# Patient Record
Sex: Female | Born: 1940 | Race: White | Hispanic: No
Health system: Southern US, Community
[De-identification: ages and names within clinical notes are randomized; demographics above are authoritative.]

---

## 2001-11-29 ENCOUNTER — Other Ambulatory Visit: Admission: RE | Admit: 2001-11-29 | Discharge: 2001-11-29 | Payer: Self-pay | Admitting: Orthopaedic Surgery

## 2002-07-17 ENCOUNTER — Other Ambulatory Visit: Admission: RE | Admit: 2002-07-17 | Discharge: 2002-07-17 | Payer: Self-pay | Admitting: Orthopaedic Surgery

## 2003-11-30 ENCOUNTER — Ambulatory Visit (HOSPITAL_BASED_OUTPATIENT_CLINIC_OR_DEPARTMENT_OTHER): Admission: RE | Admit: 2003-11-30 | Discharge: 2003-11-30 | Payer: Self-pay | Admitting: Specialist

## 2003-11-30 ENCOUNTER — Ambulatory Visit (HOSPITAL_COMMUNITY): Admission: RE | Admit: 2003-11-30 | Discharge: 2003-11-30 | Payer: Self-pay | Admitting: Specialist

## 2005-04-03 ENCOUNTER — Other Ambulatory Visit: Admission: RE | Admit: 2005-04-03 | Discharge: 2005-04-03 | Payer: Self-pay | Admitting: Orthopaedic Surgery

## 2005-11-07 ENCOUNTER — Inpatient Hospital Stay (HOSPITAL_COMMUNITY): Admission: RE | Admit: 2005-11-07 | Discharge: 2005-11-11 | Payer: Self-pay | Admitting: Orthopaedic Surgery

## 2005-12-12 ENCOUNTER — Inpatient Hospital Stay (HOSPITAL_COMMUNITY): Admission: RE | Admit: 2005-12-12 | Discharge: 2005-12-15 | Payer: Self-pay | Admitting: Orthopaedic Surgery

## 2007-01-28 IMAGING — CR DG KNEE 1-2V PORT*L*
2 series · 2 of 2 positions shown · non-contrast
Comparison: none

CLINICAL DATA: Severe degenerative joint disease, left knee. Total left knee arthroplasty.  
 LEFT KNEE PORTABLE - 2 VIEW:
 AP and lateral portable views of the left knee are made 11/07/05 at 4622 hours and show good position of all three components of the total knee prosthesis.  There is a large surgical drain associated with the superior aspect of the knee joint.

[view not recorded (1 of 2)]
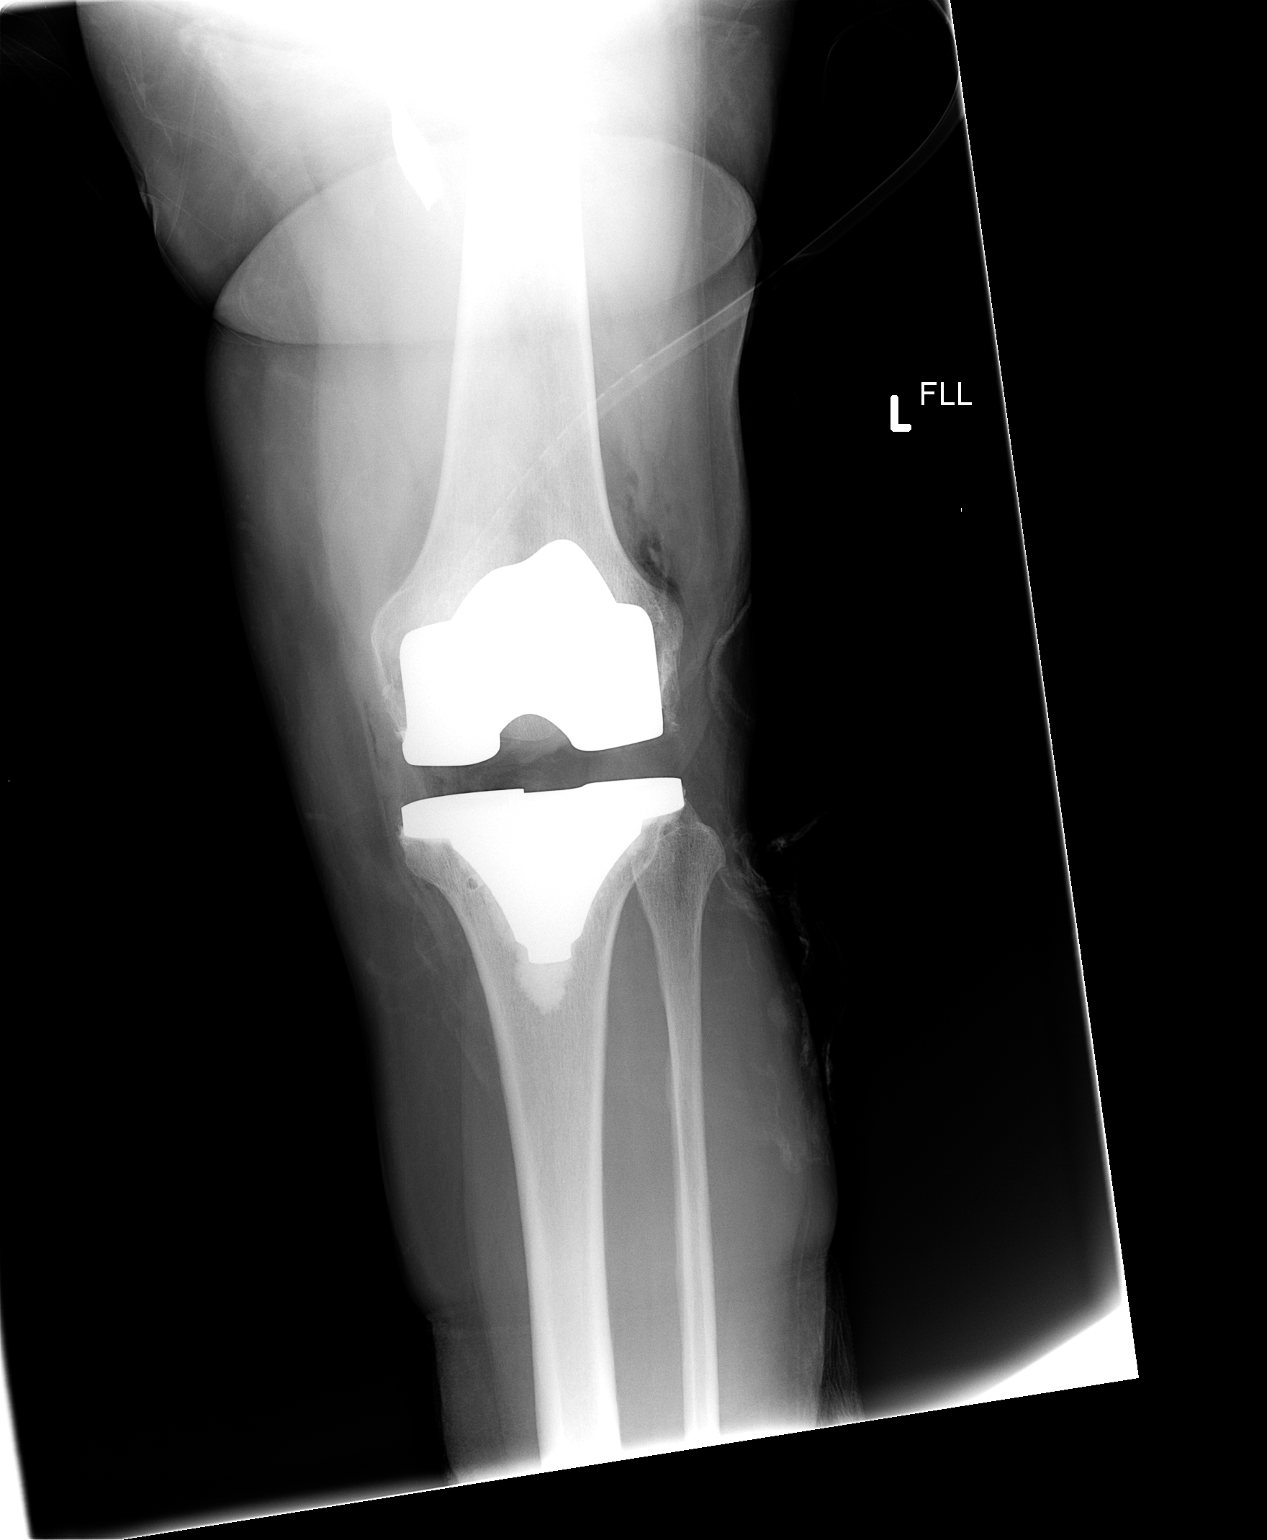

[view not recorded (2 of 2)]
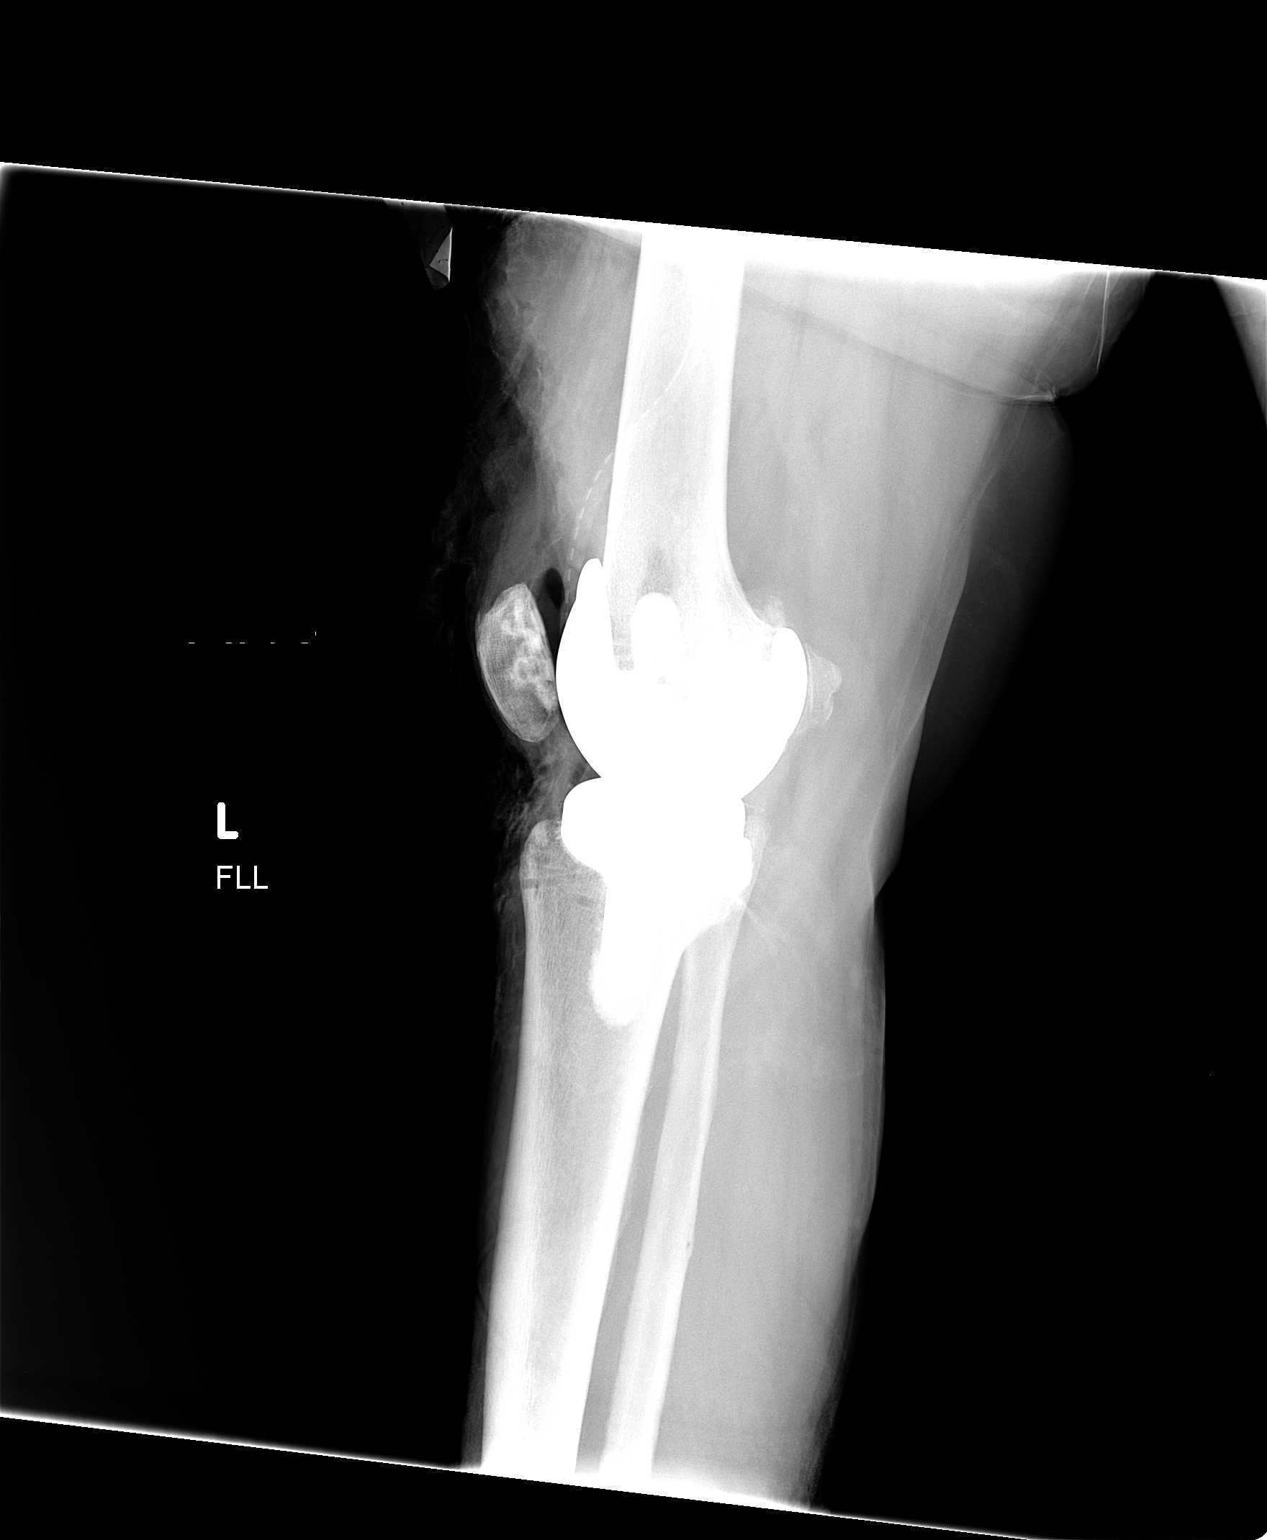

[2 of 2 positions shown; findings below may reference images not displayed]

IMPRESSION: Good position, all three components, left total knee prosthesis.

## 2015-11-04 DIAGNOSIS — I10 Essential (primary) hypertension: Secondary | ICD-10-CM | POA: Diagnosis not present

## 2015-11-04 DIAGNOSIS — Z789 Other specified health status: Secondary | ICD-10-CM | POA: Diagnosis not present

## 2015-11-04 DIAGNOSIS — Z6841 Body Mass Index (BMI) 40.0 and over, adult: Secondary | ICD-10-CM | POA: Diagnosis not present

## 2015-11-04 DIAGNOSIS — E7800 Pure hypercholesterolemia, unspecified: Secondary | ICD-10-CM | POA: Diagnosis not present

## 2015-11-04 DIAGNOSIS — M25551 Pain in right hip: Secondary | ICD-10-CM | POA: Diagnosis not present

## 2015-12-16 DIAGNOSIS — Z1389 Encounter for screening for other disorder: Secondary | ICD-10-CM | POA: Diagnosis not present

## 2015-12-16 DIAGNOSIS — Z0000 Encounter for general adult medical examination without abnormal findings: Secondary | ICD-10-CM | POA: Diagnosis not present

## 2015-12-16 DIAGNOSIS — Z418 Encounter for other procedures for purposes other than remedying health state: Secondary | ICD-10-CM | POA: Diagnosis not present

## 2015-12-16 DIAGNOSIS — E7800 Pure hypercholesterolemia, unspecified: Secondary | ICD-10-CM | POA: Diagnosis not present

## 2015-12-16 DIAGNOSIS — Z7189 Other specified counseling: Secondary | ICD-10-CM | POA: Diagnosis not present

## 2015-12-16 DIAGNOSIS — Z79899 Other long term (current) drug therapy: Secondary | ICD-10-CM | POA: Diagnosis not present

## 2015-12-16 DIAGNOSIS — E039 Hypothyroidism, unspecified: Secondary | ICD-10-CM | POA: Diagnosis not present

## 2015-12-16 DIAGNOSIS — L309 Dermatitis, unspecified: Secondary | ICD-10-CM | POA: Diagnosis not present

## 2015-12-16 DIAGNOSIS — Z1211 Encounter for screening for malignant neoplasm of colon: Secondary | ICD-10-CM | POA: Diagnosis not present

## 2015-12-23 DIAGNOSIS — M47816 Spondylosis without myelopathy or radiculopathy, lumbar region: Secondary | ICD-10-CM | POA: Diagnosis not present

## 2015-12-23 DIAGNOSIS — M4806 Spinal stenosis, lumbar region: Secondary | ICD-10-CM | POA: Diagnosis not present

## 2015-12-23 DIAGNOSIS — M4316 Spondylolisthesis, lumbar region: Secondary | ICD-10-CM | POA: Diagnosis not present

## 2015-12-23 DIAGNOSIS — M419 Scoliosis, unspecified: Secondary | ICD-10-CM | POA: Diagnosis not present

## 2016-01-25 DIAGNOSIS — E7800 Pure hypercholesterolemia, unspecified: Secondary | ICD-10-CM | POA: Diagnosis not present

## 2016-01-25 DIAGNOSIS — I10 Essential (primary) hypertension: Secondary | ICD-10-CM | POA: Diagnosis not present

## 2016-02-14 DIAGNOSIS — I10 Essential (primary) hypertension: Secondary | ICD-10-CM | POA: Diagnosis not present

## 2016-02-14 DIAGNOSIS — E7800 Pure hypercholesterolemia, unspecified: Secondary | ICD-10-CM | POA: Diagnosis not present

## 2016-03-02 DIAGNOSIS — E2839 Other primary ovarian failure: Secondary | ICD-10-CM | POA: Diagnosis not present

## 2016-03-03 DIAGNOSIS — E7800 Pure hypercholesterolemia, unspecified: Secondary | ICD-10-CM | POA: Diagnosis not present

## 2016-03-03 DIAGNOSIS — I10 Essential (primary) hypertension: Secondary | ICD-10-CM | POA: Diagnosis not present

## 2016-03-23 DIAGNOSIS — Z1231 Encounter for screening mammogram for malignant neoplasm of breast: Secondary | ICD-10-CM | POA: Diagnosis not present

## 2016-03-23 DIAGNOSIS — H2511 Age-related nuclear cataract, right eye: Secondary | ICD-10-CM | POA: Diagnosis not present

## 2016-03-23 DIAGNOSIS — H538 Other visual disturbances: Secondary | ICD-10-CM | POA: Diagnosis not present

## 2016-04-03 DIAGNOSIS — Z981 Arthrodesis status: Secondary | ICD-10-CM | POA: Diagnosis not present

## 2016-04-03 DIAGNOSIS — M47816 Spondylosis without myelopathy or radiculopathy, lumbar region: Secondary | ICD-10-CM | POA: Diagnosis not present

## 2016-04-03 DIAGNOSIS — M4326 Fusion of spine, lumbar region: Secondary | ICD-10-CM | POA: Diagnosis not present

## 2016-04-05 DIAGNOSIS — M47816 Spondylosis without myelopathy or radiculopathy, lumbar region: Secondary | ICD-10-CM | POA: Diagnosis not present

## 2016-04-10 DIAGNOSIS — H2511 Age-related nuclear cataract, right eye: Secondary | ICD-10-CM | POA: Diagnosis not present

## 2016-04-10 DIAGNOSIS — Z6841 Body Mass Index (BMI) 40.0 and over, adult: Secondary | ICD-10-CM | POA: Diagnosis not present

## 2016-04-10 DIAGNOSIS — E039 Hypothyroidism, unspecified: Secondary | ICD-10-CM | POA: Diagnosis not present

## 2016-04-10 DIAGNOSIS — I10 Essential (primary) hypertension: Secondary | ICD-10-CM | POA: Diagnosis not present

## 2016-04-10 DIAGNOSIS — M1990 Unspecified osteoarthritis, unspecified site: Secondary | ICD-10-CM | POA: Diagnosis not present

## 2016-04-10 DIAGNOSIS — H5200 Hypermetropia, unspecified eye: Secondary | ICD-10-CM | POA: Diagnosis not present

## 2016-04-10 DIAGNOSIS — H269 Unspecified cataract: Secondary | ICD-10-CM | POA: Diagnosis not present

## 2016-04-10 DIAGNOSIS — H524 Presbyopia: Secondary | ICD-10-CM | POA: Diagnosis not present

## 2016-04-10 DIAGNOSIS — Z79899 Other long term (current) drug therapy: Secondary | ICD-10-CM | POA: Diagnosis not present

## 2016-04-10 DIAGNOSIS — E669 Obesity, unspecified: Secondary | ICD-10-CM | POA: Diagnosis not present

## 2016-04-10 DIAGNOSIS — K219 Gastro-esophageal reflux disease without esophagitis: Secondary | ICD-10-CM | POA: Diagnosis not present

## 2016-04-10 DIAGNOSIS — H538 Other visual disturbances: Secondary | ICD-10-CM | POA: Diagnosis not present

## 2016-04-10 DIAGNOSIS — H2513 Age-related nuclear cataract, bilateral: Secondary | ICD-10-CM | POA: Diagnosis not present

## 2016-04-10 DIAGNOSIS — H52209 Unspecified astigmatism, unspecified eye: Secondary | ICD-10-CM | POA: Diagnosis not present

## 2016-04-10 DIAGNOSIS — H04129 Dry eye syndrome of unspecified lacrimal gland: Secondary | ICD-10-CM | POA: Diagnosis not present

## 2016-04-17 DIAGNOSIS — I10 Essential (primary) hypertension: Secondary | ICD-10-CM | POA: Diagnosis not present

## 2016-04-17 DIAGNOSIS — E7800 Pure hypercholesterolemia, unspecified: Secondary | ICD-10-CM | POA: Diagnosis not present

## 2016-05-03 DIAGNOSIS — Z299 Encounter for prophylactic measures, unspecified: Secondary | ICD-10-CM | POA: Diagnosis not present

## 2016-05-03 DIAGNOSIS — G4700 Insomnia, unspecified: Secondary | ICD-10-CM | POA: Diagnosis not present

## 2016-05-03 DIAGNOSIS — M810 Age-related osteoporosis without current pathological fracture: Secondary | ICD-10-CM | POA: Diagnosis not present

## 2016-05-03 DIAGNOSIS — M545 Low back pain: Secondary | ICD-10-CM | POA: Diagnosis not present

## 2016-05-19 DIAGNOSIS — E7800 Pure hypercholesterolemia, unspecified: Secondary | ICD-10-CM | POA: Diagnosis not present

## 2016-05-19 DIAGNOSIS — I10 Essential (primary) hypertension: Secondary | ICD-10-CM | POA: Diagnosis not present

## 2016-05-25 DIAGNOSIS — H538 Other visual disturbances: Secondary | ICD-10-CM | POA: Diagnosis not present

## 2016-05-25 DIAGNOSIS — H2512 Age-related nuclear cataract, left eye: Secondary | ICD-10-CM | POA: Diagnosis not present

## 2016-06-12 DIAGNOSIS — Z79899 Other long term (current) drug therapy: Secondary | ICD-10-CM | POA: Diagnosis not present

## 2016-06-12 DIAGNOSIS — K219 Gastro-esophageal reflux disease without esophagitis: Secondary | ICD-10-CM | POA: Diagnosis not present

## 2016-06-12 DIAGNOSIS — M5430 Sciatica, unspecified side: Secondary | ICD-10-CM | POA: Diagnosis not present

## 2016-06-12 DIAGNOSIS — H04129 Dry eye syndrome of unspecified lacrimal gland: Secondary | ICD-10-CM | POA: Diagnosis not present

## 2016-06-12 DIAGNOSIS — H5200 Hypermetropia, unspecified eye: Secondary | ICD-10-CM | POA: Diagnosis not present

## 2016-06-12 DIAGNOSIS — M1990 Unspecified osteoarthritis, unspecified site: Secondary | ICD-10-CM | POA: Diagnosis not present

## 2016-06-12 DIAGNOSIS — I10 Essential (primary) hypertension: Secondary | ICD-10-CM | POA: Diagnosis not present

## 2016-06-12 DIAGNOSIS — H2512 Age-related nuclear cataract, left eye: Secondary | ICD-10-CM | POA: Diagnosis not present

## 2016-06-12 DIAGNOSIS — E669 Obesity, unspecified: Secondary | ICD-10-CM | POA: Diagnosis not present

## 2016-06-12 DIAGNOSIS — H538 Other visual disturbances: Secondary | ICD-10-CM | POA: Diagnosis not present

## 2016-06-12 DIAGNOSIS — Z961 Presence of intraocular lens: Secondary | ICD-10-CM | POA: Diagnosis not present

## 2016-06-12 DIAGNOSIS — Z6841 Body Mass Index (BMI) 40.0 and over, adult: Secondary | ICD-10-CM | POA: Diagnosis not present

## 2016-06-12 DIAGNOSIS — H269 Unspecified cataract: Secondary | ICD-10-CM | POA: Diagnosis not present

## 2016-06-12 DIAGNOSIS — E039 Hypothyroidism, unspecified: Secondary | ICD-10-CM | POA: Diagnosis not present

## 2016-06-12 DIAGNOSIS — H524 Presbyopia: Secondary | ICD-10-CM | POA: Diagnosis not present

## 2016-06-12 DIAGNOSIS — H52209 Unspecified astigmatism, unspecified eye: Secondary | ICD-10-CM | POA: Diagnosis not present

## 2016-11-02 DIAGNOSIS — G4700 Insomnia, unspecified: Secondary | ICD-10-CM | POA: Diagnosis not present

## 2016-11-02 DIAGNOSIS — Z713 Dietary counseling and surveillance: Secondary | ICD-10-CM | POA: Diagnosis not present

## 2016-11-02 DIAGNOSIS — Z299 Encounter for prophylactic measures, unspecified: Secondary | ICD-10-CM | POA: Diagnosis not present

## 2016-11-02 DIAGNOSIS — Z6841 Body Mass Index (BMI) 40.0 and over, adult: Secondary | ICD-10-CM | POA: Diagnosis not present

## 2016-11-02 DIAGNOSIS — M25551 Pain in right hip: Secondary | ICD-10-CM | POA: Diagnosis not present

## 2016-12-22 DIAGNOSIS — Z961 Presence of intraocular lens: Secondary | ICD-10-CM | POA: Diagnosis not present

## 2017-01-17 DIAGNOSIS — R2689 Other abnormalities of gait and mobility: Secondary | ICD-10-CM | POA: Diagnosis not present

## 2017-01-17 DIAGNOSIS — I10 Essential (primary) hypertension: Secondary | ICD-10-CM | POA: Diagnosis not present

## 2017-01-17 DIAGNOSIS — Z1211 Encounter for screening for malignant neoplasm of colon: Secondary | ICD-10-CM | POA: Diagnosis not present

## 2017-01-17 DIAGNOSIS — Z79899 Other long term (current) drug therapy: Secondary | ICD-10-CM | POA: Diagnosis not present

## 2017-01-17 DIAGNOSIS — E7800 Pure hypercholesterolemia, unspecified: Secondary | ICD-10-CM | POA: Diagnosis not present

## 2017-01-17 DIAGNOSIS — K219 Gastro-esophageal reflux disease without esophagitis: Secondary | ICD-10-CM | POA: Diagnosis not present

## 2017-01-17 DIAGNOSIS — R5383 Other fatigue: Secondary | ICD-10-CM | POA: Diagnosis not present

## 2017-01-17 DIAGNOSIS — Z713 Dietary counseling and surveillance: Secondary | ICD-10-CM | POA: Diagnosis not present

## 2017-01-17 DIAGNOSIS — Z1389 Encounter for screening for other disorder: Secondary | ICD-10-CM | POA: Diagnosis not present

## 2017-01-17 DIAGNOSIS — E039 Hypothyroidism, unspecified: Secondary | ICD-10-CM | POA: Diagnosis not present

## 2017-01-17 DIAGNOSIS — Z299 Encounter for prophylactic measures, unspecified: Secondary | ICD-10-CM | POA: Diagnosis not present

## 2017-01-17 DIAGNOSIS — Z7189 Other specified counseling: Secondary | ICD-10-CM | POA: Diagnosis not present

## 2017-01-17 DIAGNOSIS — Z0000 Encounter for general adult medical examination without abnormal findings: Secondary | ICD-10-CM | POA: Diagnosis not present

## 2017-02-06 DIAGNOSIS — H04123 Dry eye syndrome of bilateral lacrimal glands: Secondary | ICD-10-CM | POA: Diagnosis not present

## 2017-02-06 DIAGNOSIS — H17822 Peripheral opacity of cornea, left eye: Secondary | ICD-10-CM | POA: Diagnosis not present

## 2017-02-06 DIAGNOSIS — H16223 Keratoconjunctivitis sicca, not specified as Sjogren's, bilateral: Secondary | ICD-10-CM | POA: Diagnosis not present

## 2017-02-06 DIAGNOSIS — Z9849 Cataract extraction status, unspecified eye: Secondary | ICD-10-CM | POA: Diagnosis not present

## 2017-02-06 DIAGNOSIS — Z961 Presence of intraocular lens: Secondary | ICD-10-CM | POA: Diagnosis not present

## 2017-03-27 DIAGNOSIS — Z1231 Encounter for screening mammogram for malignant neoplasm of breast: Secondary | ICD-10-CM | POA: Diagnosis not present

## 2017-05-01 DIAGNOSIS — I10 Essential (primary) hypertension: Secondary | ICD-10-CM | POA: Diagnosis not present

## 2017-05-01 DIAGNOSIS — Z299 Encounter for prophylactic measures, unspecified: Secondary | ICD-10-CM | POA: Diagnosis not present

## 2017-05-01 DIAGNOSIS — Z713 Dietary counseling and surveillance: Secondary | ICD-10-CM | POA: Diagnosis not present

## 2017-05-01 DIAGNOSIS — M545 Low back pain: Secondary | ICD-10-CM | POA: Diagnosis not present

## 2017-05-01 DIAGNOSIS — G5790 Unspecified mononeuropathy of unspecified lower limb: Secondary | ICD-10-CM | POA: Diagnosis not present

## 2017-05-01 DIAGNOSIS — Z6838 Body mass index (BMI) 38.0-38.9, adult: Secondary | ICD-10-CM | POA: Diagnosis not present

## 2017-05-01 DIAGNOSIS — G4700 Insomnia, unspecified: Secondary | ICD-10-CM | POA: Diagnosis not present

## 2017-05-01 DIAGNOSIS — E039 Hypothyroidism, unspecified: Secondary | ICD-10-CM | POA: Diagnosis not present

## 2017-05-01 DIAGNOSIS — E7800 Pure hypercholesterolemia, unspecified: Secondary | ICD-10-CM | POA: Diagnosis not present

## 2017-08-24 DIAGNOSIS — L0291 Cutaneous abscess, unspecified: Secondary | ICD-10-CM | POA: Diagnosis not present

## 2017-08-24 DIAGNOSIS — Z299 Encounter for prophylactic measures, unspecified: Secondary | ICD-10-CM | POA: Diagnosis not present

## 2017-08-24 DIAGNOSIS — E039 Hypothyroidism, unspecified: Secondary | ICD-10-CM | POA: Diagnosis not present

## 2017-08-24 DIAGNOSIS — I10 Essential (primary) hypertension: Secondary | ICD-10-CM | POA: Diagnosis not present

## 2017-08-24 DIAGNOSIS — Z6834 Body mass index (BMI) 34.0-34.9, adult: Secondary | ICD-10-CM | POA: Diagnosis not present

## 2017-08-24 DIAGNOSIS — E7800 Pure hypercholesterolemia, unspecified: Secondary | ICD-10-CM | POA: Diagnosis not present

## 2017-10-01 DIAGNOSIS — Z01419 Encounter for gynecological examination (general) (routine) without abnormal findings: Secondary | ICD-10-CM | POA: Diagnosis not present

## 2017-11-01 DIAGNOSIS — E7800 Pure hypercholesterolemia, unspecified: Secondary | ICD-10-CM | POA: Diagnosis not present

## 2017-11-01 DIAGNOSIS — Z6841 Body Mass Index (BMI) 40.0 and over, adult: Secondary | ICD-10-CM | POA: Diagnosis not present

## 2017-11-01 DIAGNOSIS — I10 Essential (primary) hypertension: Secondary | ICD-10-CM | POA: Diagnosis not present

## 2017-11-01 DIAGNOSIS — E039 Hypothyroidism, unspecified: Secondary | ICD-10-CM | POA: Diagnosis not present

## 2017-11-01 DIAGNOSIS — G4700 Insomnia, unspecified: Secondary | ICD-10-CM | POA: Diagnosis not present

## 2017-11-01 DIAGNOSIS — M545 Low back pain: Secondary | ICD-10-CM | POA: Diagnosis not present

## 2017-11-01 DIAGNOSIS — Z299 Encounter for prophylactic measures, unspecified: Secondary | ICD-10-CM | POA: Diagnosis not present

## 2017-11-26 DIAGNOSIS — I10 Essential (primary) hypertension: Secondary | ICD-10-CM | POA: Diagnosis not present

## 2017-11-26 DIAGNOSIS — Z789 Other specified health status: Secondary | ICD-10-CM | POA: Diagnosis not present

## 2017-11-26 DIAGNOSIS — Z299 Encounter for prophylactic measures, unspecified: Secondary | ICD-10-CM | POA: Diagnosis not present

## 2017-11-26 DIAGNOSIS — Z6841 Body Mass Index (BMI) 40.0 and over, adult: Secondary | ICD-10-CM | POA: Diagnosis not present

## 2017-11-26 DIAGNOSIS — L0291 Cutaneous abscess, unspecified: Secondary | ICD-10-CM | POA: Diagnosis not present

## 2017-12-21 DIAGNOSIS — L0291 Cutaneous abscess, unspecified: Secondary | ICD-10-CM | POA: Diagnosis not present

## 2017-12-21 DIAGNOSIS — I10 Essential (primary) hypertension: Secondary | ICD-10-CM | POA: Diagnosis not present

## 2017-12-21 DIAGNOSIS — Z299 Encounter for prophylactic measures, unspecified: Secondary | ICD-10-CM | POA: Diagnosis not present

## 2017-12-21 DIAGNOSIS — G4700 Insomnia, unspecified: Secondary | ICD-10-CM | POA: Diagnosis not present

## 2017-12-21 DIAGNOSIS — E039 Hypothyroidism, unspecified: Secondary | ICD-10-CM | POA: Diagnosis not present

## 2017-12-21 DIAGNOSIS — E7800 Pure hypercholesterolemia, unspecified: Secondary | ICD-10-CM | POA: Diagnosis not present

## 2017-12-21 DIAGNOSIS — Z6841 Body Mass Index (BMI) 40.0 and over, adult: Secondary | ICD-10-CM | POA: Diagnosis not present

## 2018-01-24 DIAGNOSIS — I10 Essential (primary) hypertension: Secondary | ICD-10-CM | POA: Diagnosis not present

## 2018-01-24 DIAGNOSIS — Z299 Encounter for prophylactic measures, unspecified: Secondary | ICD-10-CM | POA: Diagnosis not present

## 2018-01-24 DIAGNOSIS — Z0000 Encounter for general adult medical examination without abnormal findings: Secondary | ICD-10-CM | POA: Diagnosis not present

## 2018-01-24 DIAGNOSIS — Z79899 Other long term (current) drug therapy: Secondary | ICD-10-CM | POA: Diagnosis not present

## 2018-01-24 DIAGNOSIS — Z7189 Other specified counseling: Secondary | ICD-10-CM | POA: Diagnosis not present

## 2018-01-24 DIAGNOSIS — E039 Hypothyroidism, unspecified: Secondary | ICD-10-CM | POA: Diagnosis not present

## 2018-01-24 DIAGNOSIS — Z1211 Encounter for screening for malignant neoplasm of colon: Secondary | ICD-10-CM | POA: Diagnosis not present

## 2018-01-24 DIAGNOSIS — Z1339 Encounter for screening examination for other mental health and behavioral disorders: Secondary | ICD-10-CM | POA: Diagnosis not present

## 2018-01-24 DIAGNOSIS — R5383 Other fatigue: Secondary | ICD-10-CM | POA: Diagnosis not present

## 2018-01-24 DIAGNOSIS — Z6841 Body Mass Index (BMI) 40.0 and over, adult: Secondary | ICD-10-CM | POA: Diagnosis not present

## 2018-01-24 DIAGNOSIS — Z1331 Encounter for screening for depression: Secondary | ICD-10-CM | POA: Diagnosis not present

## 2018-02-04 DIAGNOSIS — Z299 Encounter for prophylactic measures, unspecified: Secondary | ICD-10-CM | POA: Diagnosis not present

## 2018-02-04 DIAGNOSIS — L821 Other seborrheic keratosis: Secondary | ICD-10-CM | POA: Diagnosis not present

## 2018-02-04 DIAGNOSIS — Z6841 Body Mass Index (BMI) 40.0 and over, adult: Secondary | ICD-10-CM | POA: Diagnosis not present

## 2018-03-28 DIAGNOSIS — Z1231 Encounter for screening mammogram for malignant neoplasm of breast: Secondary | ICD-10-CM | POA: Diagnosis not present

## 2018-04-04 DIAGNOSIS — E2839 Other primary ovarian failure: Secondary | ICD-10-CM | POA: Diagnosis not present

## 2018-04-16 DIAGNOSIS — M79604 Pain in right leg: Secondary | ICD-10-CM | POA: Diagnosis not present

## 2018-04-16 DIAGNOSIS — E7800 Pure hypercholesterolemia, unspecified: Secondary | ICD-10-CM | POA: Diagnosis not present

## 2018-04-16 DIAGNOSIS — Z6841 Body Mass Index (BMI) 40.0 and over, adult: Secondary | ICD-10-CM | POA: Diagnosis not present

## 2018-04-16 DIAGNOSIS — Z299 Encounter for prophylactic measures, unspecified: Secondary | ICD-10-CM | POA: Diagnosis not present

## 2018-04-16 DIAGNOSIS — E039 Hypothyroidism, unspecified: Secondary | ICD-10-CM | POA: Diagnosis not present

## 2018-04-16 DIAGNOSIS — I10 Essential (primary) hypertension: Secondary | ICD-10-CM | POA: Diagnosis not present

## 2018-04-16 DIAGNOSIS — M810 Age-related osteoporosis without current pathological fracture: Secondary | ICD-10-CM | POA: Diagnosis not present

## 2018-05-07 DIAGNOSIS — L98499 Non-pressure chronic ulcer of skin of other sites with unspecified severity: Secondary | ICD-10-CM | POA: Diagnosis not present

## 2018-05-07 DIAGNOSIS — L03311 Cellulitis of abdominal wall: Secondary | ICD-10-CM | POA: Diagnosis not present

## 2018-05-07 DIAGNOSIS — Z299 Encounter for prophylactic measures, unspecified: Secondary | ICD-10-CM | POA: Diagnosis not present

## 2018-05-07 DIAGNOSIS — E039 Hypothyroidism, unspecified: Secondary | ICD-10-CM | POA: Diagnosis not present

## 2018-05-07 DIAGNOSIS — Z6841 Body Mass Index (BMI) 40.0 and over, adult: Secondary | ICD-10-CM | POA: Diagnosis not present

## 2018-05-07 DIAGNOSIS — I10 Essential (primary) hypertension: Secondary | ICD-10-CM | POA: Diagnosis not present

## 2018-05-07 DIAGNOSIS — M545 Low back pain: Secondary | ICD-10-CM | POA: Diagnosis not present

## 2018-05-08 DIAGNOSIS — Z299 Encounter for prophylactic measures, unspecified: Secondary | ICD-10-CM | POA: Diagnosis not present

## 2018-05-08 DIAGNOSIS — E039 Hypothyroidism, unspecified: Secondary | ICD-10-CM | POA: Diagnosis not present

## 2018-05-08 DIAGNOSIS — L03311 Cellulitis of abdominal wall: Secondary | ICD-10-CM | POA: Diagnosis not present

## 2018-05-08 DIAGNOSIS — L98499 Non-pressure chronic ulcer of skin of other sites with unspecified severity: Secondary | ICD-10-CM | POA: Diagnosis not present

## 2018-05-08 DIAGNOSIS — I10 Essential (primary) hypertension: Secondary | ICD-10-CM | POA: Diagnosis not present

## 2018-05-08 DIAGNOSIS — Z6841 Body Mass Index (BMI) 40.0 and over, adult: Secondary | ICD-10-CM | POA: Diagnosis not present

## 2018-05-09 DIAGNOSIS — K219 Gastro-esophageal reflux disease without esophagitis: Secondary | ICD-10-CM | POA: Diagnosis not present

## 2018-05-09 DIAGNOSIS — I10 Essential (primary) hypertension: Secondary | ICD-10-CM | POA: Diagnosis not present

## 2018-05-09 DIAGNOSIS — L98499 Non-pressure chronic ulcer of skin of other sites with unspecified severity: Secondary | ICD-10-CM | POA: Diagnosis not present

## 2018-05-09 DIAGNOSIS — Z6841 Body Mass Index (BMI) 40.0 and over, adult: Secondary | ICD-10-CM | POA: Diagnosis not present

## 2018-05-09 DIAGNOSIS — E039 Hypothyroidism, unspecified: Secondary | ICD-10-CM | POA: Diagnosis not present

## 2018-05-09 DIAGNOSIS — Z299 Encounter for prophylactic measures, unspecified: Secondary | ICD-10-CM | POA: Diagnosis not present

## 2018-05-17 DIAGNOSIS — E039 Hypothyroidism, unspecified: Secondary | ICD-10-CM | POA: Diagnosis not present

## 2018-05-17 DIAGNOSIS — L98499 Non-pressure chronic ulcer of skin of other sites with unspecified severity: Secondary | ICD-10-CM | POA: Diagnosis not present

## 2018-05-17 DIAGNOSIS — Z6841 Body Mass Index (BMI) 40.0 and over, adult: Secondary | ICD-10-CM | POA: Diagnosis not present

## 2018-05-17 DIAGNOSIS — I10 Essential (primary) hypertension: Secondary | ICD-10-CM | POA: Diagnosis not present

## 2018-05-17 DIAGNOSIS — Z299 Encounter for prophylactic measures, unspecified: Secondary | ICD-10-CM | POA: Diagnosis not present

## 2018-05-23 DIAGNOSIS — M79672 Pain in left foot: Secondary | ICD-10-CM | POA: Diagnosis not present

## 2018-05-23 DIAGNOSIS — M79674 Pain in right toe(s): Secondary | ICD-10-CM | POA: Diagnosis not present

## 2018-05-23 DIAGNOSIS — S93332A Other subluxation of left foot, initial encounter: Secondary | ICD-10-CM | POA: Diagnosis not present

## 2018-05-23 DIAGNOSIS — L110 Acquired keratosis follicularis: Secondary | ICD-10-CM | POA: Diagnosis not present

## 2018-07-30 DIAGNOSIS — R739 Hyperglycemia, unspecified: Secondary | ICD-10-CM | POA: Diagnosis not present

## 2018-07-30 DIAGNOSIS — L0291 Cutaneous abscess, unspecified: Secondary | ICD-10-CM | POA: Diagnosis not present

## 2018-07-30 DIAGNOSIS — Z6841 Body Mass Index (BMI) 40.0 and over, adult: Secondary | ICD-10-CM | POA: Diagnosis not present

## 2018-07-30 DIAGNOSIS — L03311 Cellulitis of abdominal wall: Secondary | ICD-10-CM | POA: Diagnosis not present

## 2018-07-30 DIAGNOSIS — I10 Essential (primary) hypertension: Secondary | ICD-10-CM | POA: Diagnosis not present

## 2018-07-30 DIAGNOSIS — Z299 Encounter for prophylactic measures, unspecified: Secondary | ICD-10-CM | POA: Diagnosis not present

## 2018-07-31 DIAGNOSIS — I10 Essential (primary) hypertension: Secondary | ICD-10-CM | POA: Diagnosis not present

## 2018-07-31 DIAGNOSIS — L0291 Cutaneous abscess, unspecified: Secondary | ICD-10-CM | POA: Diagnosis not present

## 2018-07-31 DIAGNOSIS — Z299 Encounter for prophylactic measures, unspecified: Secondary | ICD-10-CM | POA: Diagnosis not present

## 2018-07-31 DIAGNOSIS — Z6841 Body Mass Index (BMI) 40.0 and over, adult: Secondary | ICD-10-CM | POA: Diagnosis not present

## 2018-07-31 DIAGNOSIS — Z713 Dietary counseling and surveillance: Secondary | ICD-10-CM | POA: Diagnosis not present

## 2018-08-01 DIAGNOSIS — Z299 Encounter for prophylactic measures, unspecified: Secondary | ICD-10-CM | POA: Diagnosis not present

## 2018-08-01 DIAGNOSIS — I10 Essential (primary) hypertension: Secondary | ICD-10-CM | POA: Diagnosis not present

## 2018-08-01 DIAGNOSIS — Z6841 Body Mass Index (BMI) 40.0 and over, adult: Secondary | ICD-10-CM | POA: Diagnosis not present

## 2018-08-01 DIAGNOSIS — L0390 Cellulitis, unspecified: Secondary | ICD-10-CM | POA: Diagnosis not present

## 2018-08-01 DIAGNOSIS — L0291 Cutaneous abscess, unspecified: Secondary | ICD-10-CM | POA: Diagnosis not present

## 2018-08-08 DIAGNOSIS — I10 Essential (primary) hypertension: Secondary | ICD-10-CM | POA: Diagnosis not present

## 2018-08-08 DIAGNOSIS — L0390 Cellulitis, unspecified: Secondary | ICD-10-CM | POA: Diagnosis not present

## 2018-08-08 DIAGNOSIS — Z713 Dietary counseling and surveillance: Secondary | ICD-10-CM | POA: Diagnosis not present

## 2018-08-08 DIAGNOSIS — Z23 Encounter for immunization: Secondary | ICD-10-CM | POA: Diagnosis not present

## 2018-08-08 DIAGNOSIS — Z299 Encounter for prophylactic measures, unspecified: Secondary | ICD-10-CM | POA: Diagnosis not present

## 2018-09-30 DIAGNOSIS — L0291 Cutaneous abscess, unspecified: Secondary | ICD-10-CM | POA: Diagnosis not present

## 2018-09-30 DIAGNOSIS — I10 Essential (primary) hypertension: Secondary | ICD-10-CM | POA: Diagnosis not present

## 2018-09-30 DIAGNOSIS — Z299 Encounter for prophylactic measures, unspecified: Secondary | ICD-10-CM | POA: Diagnosis not present

## 2018-09-30 DIAGNOSIS — Z6841 Body Mass Index (BMI) 40.0 and over, adult: Secondary | ICD-10-CM | POA: Diagnosis not present

## 2018-12-23 DIAGNOSIS — J309 Allergic rhinitis, unspecified: Secondary | ICD-10-CM | POA: Diagnosis not present

## 2018-12-23 DIAGNOSIS — Z6841 Body Mass Index (BMI) 40.0 and over, adult: Secondary | ICD-10-CM | POA: Diagnosis not present

## 2018-12-23 DIAGNOSIS — Z299 Encounter for prophylactic measures, unspecified: Secondary | ICD-10-CM | POA: Diagnosis not present

## 2018-12-23 DIAGNOSIS — M159 Polyosteoarthritis, unspecified: Secondary | ICD-10-CM | POA: Diagnosis not present

## 2018-12-23 DIAGNOSIS — Z789 Other specified health status: Secondary | ICD-10-CM | POA: Diagnosis not present

## 2018-12-23 DIAGNOSIS — G4700 Insomnia, unspecified: Secondary | ICD-10-CM | POA: Diagnosis not present

## 2018-12-23 DIAGNOSIS — M25522 Pain in left elbow: Secondary | ICD-10-CM | POA: Diagnosis not present

## 2018-12-23 DIAGNOSIS — I10 Essential (primary) hypertension: Secondary | ICD-10-CM | POA: Diagnosis not present

## 2019-01-09 DIAGNOSIS — J301 Allergic rhinitis due to pollen: Secondary | ICD-10-CM | POA: Diagnosis not present

## 2019-02-05 DIAGNOSIS — Z299 Encounter for prophylactic measures, unspecified: Secondary | ICD-10-CM | POA: Diagnosis not present

## 2019-02-05 DIAGNOSIS — L0292 Furuncle, unspecified: Secondary | ICD-10-CM | POA: Diagnosis not present

## 2019-02-05 DIAGNOSIS — I10 Essential (primary) hypertension: Secondary | ICD-10-CM | POA: Diagnosis not present

## 2019-06-11 DIAGNOSIS — Z1339 Encounter for screening examination for other mental health and behavioral disorders: Secondary | ICD-10-CM | POA: Diagnosis not present

## 2019-06-11 DIAGNOSIS — Z6841 Body Mass Index (BMI) 40.0 and over, adult: Secondary | ICD-10-CM | POA: Diagnosis not present

## 2019-06-11 DIAGNOSIS — Z7189 Other specified counseling: Secondary | ICD-10-CM | POA: Diagnosis not present

## 2019-06-11 DIAGNOSIS — E039 Hypothyroidism, unspecified: Secondary | ICD-10-CM | POA: Diagnosis not present

## 2019-06-11 DIAGNOSIS — G4700 Insomnia, unspecified: Secondary | ICD-10-CM | POA: Diagnosis not present

## 2019-06-11 DIAGNOSIS — M1990 Unspecified osteoarthritis, unspecified site: Secondary | ICD-10-CM | POA: Diagnosis not present

## 2019-06-11 DIAGNOSIS — I10 Essential (primary) hypertension: Secondary | ICD-10-CM | POA: Diagnosis not present

## 2019-06-11 DIAGNOSIS — Z1211 Encounter for screening for malignant neoplasm of colon: Secondary | ICD-10-CM | POA: Diagnosis not present

## 2019-06-11 DIAGNOSIS — I341 Nonrheumatic mitral (valve) prolapse: Secondary | ICD-10-CM | POA: Diagnosis not present

## 2019-06-11 DIAGNOSIS — Z79899 Other long term (current) drug therapy: Secondary | ICD-10-CM | POA: Diagnosis not present

## 2019-06-11 DIAGNOSIS — Z299 Encounter for prophylactic measures, unspecified: Secondary | ICD-10-CM | POA: Diagnosis not present

## 2019-06-11 DIAGNOSIS — Z1331 Encounter for screening for depression: Secondary | ICD-10-CM | POA: Diagnosis not present

## 2019-06-11 DIAGNOSIS — Z0000 Encounter for general adult medical examination without abnormal findings: Secondary | ICD-10-CM | POA: Diagnosis not present

## 2019-06-23 DIAGNOSIS — I340 Nonrheumatic mitral (valve) insufficiency: Secondary | ICD-10-CM | POA: Diagnosis not present

## 2019-07-03 DIAGNOSIS — Z1231 Encounter for screening mammogram for malignant neoplasm of breast: Secondary | ICD-10-CM | POA: Diagnosis not present

## 2019-07-14 DIAGNOSIS — L98499 Non-pressure chronic ulcer of skin of other sites with unspecified severity: Secondary | ICD-10-CM | POA: Diagnosis not present

## 2019-07-14 DIAGNOSIS — Z789 Other specified health status: Secondary | ICD-10-CM | POA: Diagnosis not present

## 2019-07-14 DIAGNOSIS — Z6841 Body Mass Index (BMI) 40.0 and over, adult: Secondary | ICD-10-CM | POA: Diagnosis not present

## 2019-07-14 DIAGNOSIS — I10 Essential (primary) hypertension: Secondary | ICD-10-CM | POA: Diagnosis not present

## 2019-07-14 DIAGNOSIS — Z299 Encounter for prophylactic measures, unspecified: Secondary | ICD-10-CM | POA: Diagnosis not present

## 2019-07-14 DIAGNOSIS — Z713 Dietary counseling and surveillance: Secondary | ICD-10-CM | POA: Diagnosis not present

## 2019-07-21 DIAGNOSIS — J301 Allergic rhinitis due to pollen: Secondary | ICD-10-CM | POA: Diagnosis not present

## 2019-07-23 DIAGNOSIS — J301 Allergic rhinitis due to pollen: Secondary | ICD-10-CM | POA: Diagnosis not present

## 2019-07-25 DIAGNOSIS — J301 Allergic rhinitis due to pollen: Secondary | ICD-10-CM | POA: Diagnosis not present

## 2019-07-28 DIAGNOSIS — J301 Allergic rhinitis due to pollen: Secondary | ICD-10-CM | POA: Diagnosis not present

## 2019-07-30 DIAGNOSIS — J301 Allergic rhinitis due to pollen: Secondary | ICD-10-CM | POA: Diagnosis not present

## 2019-08-01 DIAGNOSIS — J301 Allergic rhinitis due to pollen: Secondary | ICD-10-CM | POA: Diagnosis not present

## 2019-08-04 DIAGNOSIS — J301 Allergic rhinitis due to pollen: Secondary | ICD-10-CM | POA: Diagnosis not present

## 2019-08-06 DIAGNOSIS — J301 Allergic rhinitis due to pollen: Secondary | ICD-10-CM | POA: Diagnosis not present

## 2019-08-08 DIAGNOSIS — J301 Allergic rhinitis due to pollen: Secondary | ICD-10-CM | POA: Diagnosis not present

## 2019-08-11 DIAGNOSIS — J301 Allergic rhinitis due to pollen: Secondary | ICD-10-CM | POA: Diagnosis not present

## 2019-08-13 DIAGNOSIS — J301 Allergic rhinitis due to pollen: Secondary | ICD-10-CM | POA: Diagnosis not present

## 2019-08-15 DIAGNOSIS — J301 Allergic rhinitis due to pollen: Secondary | ICD-10-CM | POA: Diagnosis not present

## 2019-08-15 DIAGNOSIS — Z299 Encounter for prophylactic measures, unspecified: Secondary | ICD-10-CM | POA: Diagnosis not present

## 2019-08-15 DIAGNOSIS — Z6838 Body mass index (BMI) 38.0-38.9, adult: Secondary | ICD-10-CM | POA: Diagnosis not present

## 2019-08-15 DIAGNOSIS — M545 Low back pain: Secondary | ICD-10-CM | POA: Diagnosis not present

## 2019-08-15 DIAGNOSIS — G5790 Unspecified mononeuropathy of unspecified lower limb: Secondary | ICD-10-CM | POA: Diagnosis not present

## 2019-08-15 DIAGNOSIS — Z23 Encounter for immunization: Secondary | ICD-10-CM | POA: Diagnosis not present

## 2019-08-15 DIAGNOSIS — I10 Essential (primary) hypertension: Secondary | ICD-10-CM | POA: Diagnosis not present

## 2019-08-18 DIAGNOSIS — J301 Allergic rhinitis due to pollen: Secondary | ICD-10-CM | POA: Diagnosis not present

## 2019-08-20 DIAGNOSIS — J301 Allergic rhinitis due to pollen: Secondary | ICD-10-CM | POA: Diagnosis not present

## 2019-08-22 DIAGNOSIS — J301 Allergic rhinitis due to pollen: Secondary | ICD-10-CM | POA: Diagnosis not present

## 2019-08-25 DIAGNOSIS — J301 Allergic rhinitis due to pollen: Secondary | ICD-10-CM | POA: Diagnosis not present

## 2019-08-27 DIAGNOSIS — J301 Allergic rhinitis due to pollen: Secondary | ICD-10-CM | POA: Diagnosis not present

## 2019-08-29 DIAGNOSIS — J301 Allergic rhinitis due to pollen: Secondary | ICD-10-CM | POA: Diagnosis not present

## 2019-09-02 DIAGNOSIS — J301 Allergic rhinitis due to pollen: Secondary | ICD-10-CM | POA: Diagnosis not present

## 2019-09-03 DIAGNOSIS — J301 Allergic rhinitis due to pollen: Secondary | ICD-10-CM | POA: Diagnosis not present

## 2019-09-05 DIAGNOSIS — J301 Allergic rhinitis due to pollen: Secondary | ICD-10-CM | POA: Diagnosis not present

## 2019-09-08 DIAGNOSIS — J301 Allergic rhinitis due to pollen: Secondary | ICD-10-CM | POA: Diagnosis not present

## 2019-09-10 DIAGNOSIS — J301 Allergic rhinitis due to pollen: Secondary | ICD-10-CM | POA: Diagnosis not present

## 2019-09-12 DIAGNOSIS — J301 Allergic rhinitis due to pollen: Secondary | ICD-10-CM | POA: Diagnosis not present

## 2019-09-15 DIAGNOSIS — J301 Allergic rhinitis due to pollen: Secondary | ICD-10-CM | POA: Diagnosis not present

## 2019-09-17 DIAGNOSIS — J301 Allergic rhinitis due to pollen: Secondary | ICD-10-CM | POA: Diagnosis not present

## 2019-09-19 DIAGNOSIS — J301 Allergic rhinitis due to pollen: Secondary | ICD-10-CM | POA: Diagnosis not present

## 2019-09-22 DIAGNOSIS — J301 Allergic rhinitis due to pollen: Secondary | ICD-10-CM | POA: Diagnosis not present

## 2019-09-24 DIAGNOSIS — J301 Allergic rhinitis due to pollen: Secondary | ICD-10-CM | POA: Diagnosis not present

## 2019-09-26 DIAGNOSIS — J301 Allergic rhinitis due to pollen: Secondary | ICD-10-CM | POA: Diagnosis not present

## 2019-09-29 DIAGNOSIS — J301 Allergic rhinitis due to pollen: Secondary | ICD-10-CM | POA: Diagnosis not present

## 2019-10-01 DIAGNOSIS — J301 Allergic rhinitis due to pollen: Secondary | ICD-10-CM | POA: Diagnosis not present

## 2019-10-03 DIAGNOSIS — J301 Allergic rhinitis due to pollen: Secondary | ICD-10-CM | POA: Diagnosis not present

## 2019-10-06 DIAGNOSIS — J301 Allergic rhinitis due to pollen: Secondary | ICD-10-CM | POA: Diagnosis not present

## 2019-10-08 DIAGNOSIS — J301 Allergic rhinitis due to pollen: Secondary | ICD-10-CM | POA: Diagnosis not present

## 2019-10-10 DIAGNOSIS — J301 Allergic rhinitis due to pollen: Secondary | ICD-10-CM | POA: Diagnosis not present

## 2019-10-13 DIAGNOSIS — J301 Allergic rhinitis due to pollen: Secondary | ICD-10-CM | POA: Diagnosis not present

## 2019-10-15 DIAGNOSIS — J301 Allergic rhinitis due to pollen: Secondary | ICD-10-CM | POA: Diagnosis not present

## 2019-10-17 DIAGNOSIS — J301 Allergic rhinitis due to pollen: Secondary | ICD-10-CM | POA: Diagnosis not present

## 2019-10-20 DIAGNOSIS — J301 Allergic rhinitis due to pollen: Secondary | ICD-10-CM | POA: Diagnosis not present

## 2019-10-22 DIAGNOSIS — J301 Allergic rhinitis due to pollen: Secondary | ICD-10-CM | POA: Diagnosis not present

## 2019-10-24 DIAGNOSIS — J301 Allergic rhinitis due to pollen: Secondary | ICD-10-CM | POA: Diagnosis not present

## 2019-10-27 DIAGNOSIS — J301 Allergic rhinitis due to pollen: Secondary | ICD-10-CM | POA: Diagnosis not present

## 2019-10-29 DIAGNOSIS — J301 Allergic rhinitis due to pollen: Secondary | ICD-10-CM | POA: Diagnosis not present

## 2019-10-31 DIAGNOSIS — J301 Allergic rhinitis due to pollen: Secondary | ICD-10-CM | POA: Diagnosis not present

## 2019-11-03 DIAGNOSIS — J301 Allergic rhinitis due to pollen: Secondary | ICD-10-CM | POA: Diagnosis not present

## 2019-11-05 DIAGNOSIS — J301 Allergic rhinitis due to pollen: Secondary | ICD-10-CM | POA: Diagnosis not present

## 2019-11-07 DIAGNOSIS — J301 Allergic rhinitis due to pollen: Secondary | ICD-10-CM | POA: Diagnosis not present

## 2019-11-10 DIAGNOSIS — J301 Allergic rhinitis due to pollen: Secondary | ICD-10-CM | POA: Diagnosis not present

## 2019-11-12 DIAGNOSIS — J301 Allergic rhinitis due to pollen: Secondary | ICD-10-CM | POA: Diagnosis not present

## 2019-11-14 DIAGNOSIS — J301 Allergic rhinitis due to pollen: Secondary | ICD-10-CM | POA: Diagnosis not present

## 2019-11-17 DIAGNOSIS — J301 Allergic rhinitis due to pollen: Secondary | ICD-10-CM | POA: Diagnosis not present

## 2019-11-19 DIAGNOSIS — J301 Allergic rhinitis due to pollen: Secondary | ICD-10-CM | POA: Diagnosis not present

## 2019-11-21 DIAGNOSIS — J301 Allergic rhinitis due to pollen: Secondary | ICD-10-CM | POA: Diagnosis not present

## 2019-11-24 DIAGNOSIS — J301 Allergic rhinitis due to pollen: Secondary | ICD-10-CM | POA: Diagnosis not present

## 2019-11-26 DIAGNOSIS — J301 Allergic rhinitis due to pollen: Secondary | ICD-10-CM | POA: Diagnosis not present

## 2019-11-28 DIAGNOSIS — J301 Allergic rhinitis due to pollen: Secondary | ICD-10-CM | POA: Diagnosis not present

## 2019-12-01 DIAGNOSIS — J301 Allergic rhinitis due to pollen: Secondary | ICD-10-CM | POA: Diagnosis not present

## 2019-12-03 DIAGNOSIS — J301 Allergic rhinitis due to pollen: Secondary | ICD-10-CM | POA: Diagnosis not present

## 2019-12-05 DIAGNOSIS — J301 Allergic rhinitis due to pollen: Secondary | ICD-10-CM | POA: Diagnosis not present

## 2019-12-10 DIAGNOSIS — J301 Allergic rhinitis due to pollen: Secondary | ICD-10-CM | POA: Diagnosis not present

## 2019-12-12 DIAGNOSIS — F1120 Opioid dependence, uncomplicated: Secondary | ICD-10-CM | POA: Diagnosis not present

## 2019-12-12 DIAGNOSIS — G4700 Insomnia, unspecified: Secondary | ICD-10-CM | POA: Diagnosis not present

## 2019-12-12 DIAGNOSIS — J301 Allergic rhinitis due to pollen: Secondary | ICD-10-CM | POA: Diagnosis not present

## 2019-12-12 DIAGNOSIS — Z6834 Body mass index (BMI) 34.0-34.9, adult: Secondary | ICD-10-CM | POA: Diagnosis not present

## 2019-12-12 DIAGNOSIS — I10 Essential (primary) hypertension: Secondary | ICD-10-CM | POA: Diagnosis not present

## 2019-12-12 DIAGNOSIS — Z299 Encounter for prophylactic measures, unspecified: Secondary | ICD-10-CM | POA: Diagnosis not present

## 2019-12-12 DIAGNOSIS — Z6841 Body Mass Index (BMI) 40.0 and over, adult: Secondary | ICD-10-CM | POA: Diagnosis not present

## 2019-12-15 DIAGNOSIS — J301 Allergic rhinitis due to pollen: Secondary | ICD-10-CM | POA: Diagnosis not present

## 2019-12-17 DIAGNOSIS — J301 Allergic rhinitis due to pollen: Secondary | ICD-10-CM | POA: Diagnosis not present

## 2019-12-19 DIAGNOSIS — J301 Allergic rhinitis due to pollen: Secondary | ICD-10-CM | POA: Diagnosis not present

## 2019-12-22 DIAGNOSIS — J301 Allergic rhinitis due to pollen: Secondary | ICD-10-CM | POA: Diagnosis not present

## 2019-12-24 DIAGNOSIS — J301 Allergic rhinitis due to pollen: Secondary | ICD-10-CM | POA: Diagnosis not present

## 2019-12-26 DIAGNOSIS — J301 Allergic rhinitis due to pollen: Secondary | ICD-10-CM | POA: Diagnosis not present

## 2019-12-29 DIAGNOSIS — J301 Allergic rhinitis due to pollen: Secondary | ICD-10-CM | POA: Diagnosis not present

## 2019-12-31 DIAGNOSIS — J301 Allergic rhinitis due to pollen: Secondary | ICD-10-CM | POA: Diagnosis not present

## 2020-01-14 DIAGNOSIS — J301 Allergic rhinitis due to pollen: Secondary | ICD-10-CM | POA: Diagnosis not present

## 2020-01-16 DIAGNOSIS — J301 Allergic rhinitis due to pollen: Secondary | ICD-10-CM | POA: Diagnosis not present

## 2020-01-19 DIAGNOSIS — J301 Allergic rhinitis due to pollen: Secondary | ICD-10-CM | POA: Diagnosis not present

## 2020-01-21 DIAGNOSIS — J301 Allergic rhinitis due to pollen: Secondary | ICD-10-CM | POA: Diagnosis not present

## 2020-01-23 DIAGNOSIS — J301 Allergic rhinitis due to pollen: Secondary | ICD-10-CM | POA: Diagnosis not present

## 2020-01-26 DIAGNOSIS — J301 Allergic rhinitis due to pollen: Secondary | ICD-10-CM | POA: Diagnosis not present

## 2020-01-28 DIAGNOSIS — J301 Allergic rhinitis due to pollen: Secondary | ICD-10-CM | POA: Diagnosis not present

## 2020-01-30 DIAGNOSIS — J301 Allergic rhinitis due to pollen: Secondary | ICD-10-CM | POA: Diagnosis not present

## 2020-02-02 DIAGNOSIS — J301 Allergic rhinitis due to pollen: Secondary | ICD-10-CM | POA: Diagnosis not present

## 2020-02-04 DIAGNOSIS — J301 Allergic rhinitis due to pollen: Secondary | ICD-10-CM | POA: Diagnosis not present

## 2020-02-06 DIAGNOSIS — J301 Allergic rhinitis due to pollen: Secondary | ICD-10-CM | POA: Diagnosis not present

## 2020-02-09 DIAGNOSIS — J301 Allergic rhinitis due to pollen: Secondary | ICD-10-CM | POA: Diagnosis not present

## 2020-02-11 DIAGNOSIS — J301 Allergic rhinitis due to pollen: Secondary | ICD-10-CM | POA: Diagnosis not present

## 2020-02-13 DIAGNOSIS — J301 Allergic rhinitis due to pollen: Secondary | ICD-10-CM | POA: Diagnosis not present

## 2020-02-16 DIAGNOSIS — J301 Allergic rhinitis due to pollen: Secondary | ICD-10-CM | POA: Diagnosis not present

## 2020-02-18 DIAGNOSIS — J301 Allergic rhinitis due to pollen: Secondary | ICD-10-CM | POA: Diagnosis not present

## 2020-02-20 DIAGNOSIS — J301 Allergic rhinitis due to pollen: Secondary | ICD-10-CM | POA: Diagnosis not present

## 2020-02-23 DIAGNOSIS — J301 Allergic rhinitis due to pollen: Secondary | ICD-10-CM | POA: Diagnosis not present

## 2020-02-25 DIAGNOSIS — J301 Allergic rhinitis due to pollen: Secondary | ICD-10-CM | POA: Diagnosis not present

## 2020-02-27 DIAGNOSIS — J301 Allergic rhinitis due to pollen: Secondary | ICD-10-CM | POA: Diagnosis not present

## 2020-03-03 DIAGNOSIS — J301 Allergic rhinitis due to pollen: Secondary | ICD-10-CM | POA: Diagnosis not present

## 2020-03-05 DIAGNOSIS — I10 Essential (primary) hypertension: Secondary | ICD-10-CM | POA: Diagnosis not present

## 2020-03-05 DIAGNOSIS — J301 Allergic rhinitis due to pollen: Secondary | ICD-10-CM | POA: Diagnosis not present

## 2020-03-05 DIAGNOSIS — Z299 Encounter for prophylactic measures, unspecified: Secondary | ICD-10-CM | POA: Diagnosis not present

## 2020-03-05 DIAGNOSIS — R21 Rash and other nonspecific skin eruption: Secondary | ICD-10-CM | POA: Diagnosis not present

## 2020-03-05 DIAGNOSIS — Z789 Other specified health status: Secondary | ICD-10-CM | POA: Diagnosis not present

## 2020-03-08 DIAGNOSIS — J301 Allergic rhinitis due to pollen: Secondary | ICD-10-CM | POA: Diagnosis not present

## 2020-03-10 DIAGNOSIS — J301 Allergic rhinitis due to pollen: Secondary | ICD-10-CM | POA: Diagnosis not present

## 2020-03-12 DIAGNOSIS — J301 Allergic rhinitis due to pollen: Secondary | ICD-10-CM | POA: Diagnosis not present

## 2020-03-15 DIAGNOSIS — J301 Allergic rhinitis due to pollen: Secondary | ICD-10-CM | POA: Diagnosis not present

## 2020-03-17 DIAGNOSIS — J301 Allergic rhinitis due to pollen: Secondary | ICD-10-CM | POA: Diagnosis not present

## 2020-03-19 DIAGNOSIS — J301 Allergic rhinitis due to pollen: Secondary | ICD-10-CM | POA: Diagnosis not present

## 2020-03-22 DIAGNOSIS — J301 Allergic rhinitis due to pollen: Secondary | ICD-10-CM | POA: Diagnosis not present

## 2020-03-24 DIAGNOSIS — J301 Allergic rhinitis due to pollen: Secondary | ICD-10-CM | POA: Diagnosis not present

## 2020-03-26 DIAGNOSIS — J301 Allergic rhinitis due to pollen: Secondary | ICD-10-CM | POA: Diagnosis not present

## 2020-03-29 DIAGNOSIS — J301 Allergic rhinitis due to pollen: Secondary | ICD-10-CM | POA: Diagnosis not present

## 2020-03-31 DIAGNOSIS — J301 Allergic rhinitis due to pollen: Secondary | ICD-10-CM | POA: Diagnosis not present

## 2020-04-02 DIAGNOSIS — J301 Allergic rhinitis due to pollen: Secondary | ICD-10-CM | POA: Diagnosis not present

## 2020-04-05 DIAGNOSIS — J301 Allergic rhinitis due to pollen: Secondary | ICD-10-CM | POA: Diagnosis not present

## 2020-04-07 DIAGNOSIS — J301 Allergic rhinitis due to pollen: Secondary | ICD-10-CM | POA: Diagnosis not present

## 2020-04-09 DIAGNOSIS — J301 Allergic rhinitis due to pollen: Secondary | ICD-10-CM | POA: Diagnosis not present

## 2020-04-12 DIAGNOSIS — J301 Allergic rhinitis due to pollen: Secondary | ICD-10-CM | POA: Diagnosis not present

## 2020-04-14 DIAGNOSIS — J301 Allergic rhinitis due to pollen: Secondary | ICD-10-CM | POA: Diagnosis not present

## 2020-04-16 DIAGNOSIS — J301 Allergic rhinitis due to pollen: Secondary | ICD-10-CM | POA: Diagnosis not present

## 2020-04-19 DIAGNOSIS — J301 Allergic rhinitis due to pollen: Secondary | ICD-10-CM | POA: Diagnosis not present

## 2020-04-21 DIAGNOSIS — J301 Allergic rhinitis due to pollen: Secondary | ICD-10-CM | POA: Diagnosis not present

## 2020-04-23 DIAGNOSIS — J301 Allergic rhinitis due to pollen: Secondary | ICD-10-CM | POA: Diagnosis not present

## 2020-04-26 DIAGNOSIS — J301 Allergic rhinitis due to pollen: Secondary | ICD-10-CM | POA: Diagnosis not present

## 2020-04-28 DIAGNOSIS — J301 Allergic rhinitis due to pollen: Secondary | ICD-10-CM | POA: Diagnosis not present

## 2020-04-30 DIAGNOSIS — J301 Allergic rhinitis due to pollen: Secondary | ICD-10-CM | POA: Diagnosis not present

## 2020-05-03 DIAGNOSIS — J301 Allergic rhinitis due to pollen: Secondary | ICD-10-CM | POA: Diagnosis not present

## 2020-05-05 DIAGNOSIS — J301 Allergic rhinitis due to pollen: Secondary | ICD-10-CM | POA: Diagnosis not present

## 2020-05-07 DIAGNOSIS — J301 Allergic rhinitis due to pollen: Secondary | ICD-10-CM | POA: Diagnosis not present

## 2020-05-10 DIAGNOSIS — J301 Allergic rhinitis due to pollen: Secondary | ICD-10-CM | POA: Diagnosis not present

## 2020-05-17 DIAGNOSIS — J301 Allergic rhinitis due to pollen: Secondary | ICD-10-CM | POA: Diagnosis not present

## 2020-05-19 DIAGNOSIS — J301 Allergic rhinitis due to pollen: Secondary | ICD-10-CM | POA: Diagnosis not present

## 2020-05-21 DIAGNOSIS — J301 Allergic rhinitis due to pollen: Secondary | ICD-10-CM | POA: Diagnosis not present

## 2020-05-24 DIAGNOSIS — J301 Allergic rhinitis due to pollen: Secondary | ICD-10-CM | POA: Diagnosis not present

## 2020-05-26 DIAGNOSIS — J301 Allergic rhinitis due to pollen: Secondary | ICD-10-CM | POA: Diagnosis not present

## 2020-05-27 ENCOUNTER — Telehealth: Payer: Self-pay | Admitting: Orthopaedic Surgery

## 2020-05-27 NOTE — Telephone Encounter (Signed)
Call received from dentist office, Dr Sueanne Margarita, per Angelica Chessman - inquiring as to whether pre-med is needed for routine dental appointment. Patient relayed to them, as per previous system discharge summary note, total knee replacement surgery by Dr Hilda Lias 11/11/05.  Please advise.  States patient is already there now.  [phone], fax#[phone].  Asked if we can fax a response if unable to reach by phone. Aware Dr Hilda Lias has completed clinic today and is not in office.

## 2020-05-28 DIAGNOSIS — J301 Allergic rhinitis due to pollen: Secondary | ICD-10-CM | POA: Diagnosis not present

## 2020-05-31 DIAGNOSIS — J301 Allergic rhinitis due to pollen: Secondary | ICD-10-CM | POA: Diagnosis not present

## 2020-06-02 DIAGNOSIS — J301 Allergic rhinitis due to pollen: Secondary | ICD-10-CM | POA: Diagnosis not present

## 2020-06-04 DIAGNOSIS — J301 Allergic rhinitis due to pollen: Secondary | ICD-10-CM | POA: Diagnosis not present

## 2020-06-07 DIAGNOSIS — J301 Allergic rhinitis due to pollen: Secondary | ICD-10-CM | POA: Diagnosis not present

## 2020-06-09 DIAGNOSIS — J301 Allergic rhinitis due to pollen: Secondary | ICD-10-CM | POA: Diagnosis not present

## 2020-06-11 DIAGNOSIS — J301 Allergic rhinitis due to pollen: Secondary | ICD-10-CM | POA: Diagnosis not present

## 2020-06-14 DIAGNOSIS — J301 Allergic rhinitis due to pollen: Secondary | ICD-10-CM | POA: Diagnosis not present

## 2020-06-16 DIAGNOSIS — J301 Allergic rhinitis due to pollen: Secondary | ICD-10-CM | POA: Diagnosis not present

## 2020-06-18 DIAGNOSIS — J301 Allergic rhinitis due to pollen: Secondary | ICD-10-CM | POA: Diagnosis not present

## 2020-06-21 DIAGNOSIS — J301 Allergic rhinitis due to pollen: Secondary | ICD-10-CM | POA: Diagnosis not present

## 2020-06-23 DIAGNOSIS — J301 Allergic rhinitis due to pollen: Secondary | ICD-10-CM | POA: Diagnosis not present

## 2020-06-25 DIAGNOSIS — J301 Allergic rhinitis due to pollen: Secondary | ICD-10-CM | POA: Diagnosis not present

## 2020-06-28 DIAGNOSIS — J301 Allergic rhinitis due to pollen: Secondary | ICD-10-CM | POA: Diagnosis not present

## 2020-06-30 DIAGNOSIS — J301 Allergic rhinitis due to pollen: Secondary | ICD-10-CM | POA: Diagnosis not present

## 2020-07-02 DIAGNOSIS — J301 Allergic rhinitis due to pollen: Secondary | ICD-10-CM | POA: Diagnosis not present

## 2020-07-06 DIAGNOSIS — J301 Allergic rhinitis due to pollen: Secondary | ICD-10-CM | POA: Diagnosis not present

## 2020-07-07 DIAGNOSIS — G4700 Insomnia, unspecified: Secondary | ICD-10-CM | POA: Diagnosis not present

## 2020-07-07 DIAGNOSIS — J301 Allergic rhinitis due to pollen: Secondary | ICD-10-CM | POA: Diagnosis not present

## 2020-07-07 DIAGNOSIS — L0211 Cutaneous abscess of neck: Secondary | ICD-10-CM | POA: Diagnosis not present

## 2020-07-07 DIAGNOSIS — F1120 Opioid dependence, uncomplicated: Secondary | ICD-10-CM | POA: Diagnosis not present

## 2020-07-07 DIAGNOSIS — Z299 Encounter for prophylactic measures, unspecified: Secondary | ICD-10-CM | POA: Diagnosis not present

## 2020-07-07 DIAGNOSIS — J309 Allergic rhinitis, unspecified: Secondary | ICD-10-CM | POA: Diagnosis not present

## 2020-07-07 DIAGNOSIS — I10 Essential (primary) hypertension: Secondary | ICD-10-CM | POA: Diagnosis not present

## 2020-07-09 DIAGNOSIS — J301 Allergic rhinitis due to pollen: Secondary | ICD-10-CM | POA: Diagnosis not present

## 2020-07-12 DIAGNOSIS — J301 Allergic rhinitis due to pollen: Secondary | ICD-10-CM | POA: Diagnosis not present

## 2020-07-14 DIAGNOSIS — J301 Allergic rhinitis due to pollen: Secondary | ICD-10-CM | POA: Diagnosis not present

## 2020-07-16 DIAGNOSIS — J301 Allergic rhinitis due to pollen: Secondary | ICD-10-CM | POA: Diagnosis not present

## 2020-07-19 DIAGNOSIS — J301 Allergic rhinitis due to pollen: Secondary | ICD-10-CM | POA: Diagnosis not present

## 2020-07-20 DIAGNOSIS — Z1339 Encounter for screening examination for other mental health and behavioral disorders: Secondary | ICD-10-CM | POA: Diagnosis not present

## 2020-07-20 DIAGNOSIS — Z6841 Body Mass Index (BMI) 40.0 and over, adult: Secondary | ICD-10-CM | POA: Diagnosis not present

## 2020-07-20 DIAGNOSIS — I10 Essential (primary) hypertension: Secondary | ICD-10-CM | POA: Diagnosis not present

## 2020-07-20 DIAGNOSIS — Z299 Encounter for prophylactic measures, unspecified: Secondary | ICD-10-CM | POA: Diagnosis not present

## 2020-07-20 DIAGNOSIS — E039 Hypothyroidism, unspecified: Secondary | ICD-10-CM | POA: Diagnosis not present

## 2020-07-20 DIAGNOSIS — Z7189 Other specified counseling: Secondary | ICD-10-CM | POA: Diagnosis not present

## 2020-07-20 DIAGNOSIS — E7800 Pure hypercholesterolemia, unspecified: Secondary | ICD-10-CM | POA: Diagnosis not present

## 2020-07-20 DIAGNOSIS — R5383 Other fatigue: Secondary | ICD-10-CM | POA: Diagnosis not present

## 2020-07-20 DIAGNOSIS — Z1331 Encounter for screening for depression: Secondary | ICD-10-CM | POA: Diagnosis not present

## 2020-07-20 DIAGNOSIS — Z0000 Encounter for general adult medical examination without abnormal findings: Secondary | ICD-10-CM | POA: Diagnosis not present

## 2020-07-21 DIAGNOSIS — Z79899 Other long term (current) drug therapy: Secondary | ICD-10-CM | POA: Diagnosis not present

## 2020-07-21 DIAGNOSIS — E039 Hypothyroidism, unspecified: Secondary | ICD-10-CM | POA: Diagnosis not present

## 2020-07-21 DIAGNOSIS — R5383 Other fatigue: Secondary | ICD-10-CM | POA: Diagnosis not present

## 2020-07-21 DIAGNOSIS — E7800 Pure hypercholesterolemia, unspecified: Secondary | ICD-10-CM | POA: Diagnosis not present

## 2020-07-21 DIAGNOSIS — J301 Allergic rhinitis due to pollen: Secondary | ICD-10-CM | POA: Diagnosis not present

## 2020-07-23 DIAGNOSIS — J301 Allergic rhinitis due to pollen: Secondary | ICD-10-CM | POA: Diagnosis not present

## 2020-07-26 DIAGNOSIS — J301 Allergic rhinitis due to pollen: Secondary | ICD-10-CM | POA: Diagnosis not present

## 2020-07-26 DIAGNOSIS — H524 Presbyopia: Secondary | ICD-10-CM | POA: Diagnosis not present

## 2020-07-26 DIAGNOSIS — H35033 Hypertensive retinopathy, bilateral: Secondary | ICD-10-CM | POA: Diagnosis not present

## 2020-07-28 DIAGNOSIS — J301 Allergic rhinitis due to pollen: Secondary | ICD-10-CM | POA: Diagnosis not present

## 2020-07-30 DIAGNOSIS — J301 Allergic rhinitis due to pollen: Secondary | ICD-10-CM | POA: Diagnosis not present

## 2020-08-02 DIAGNOSIS — M545 Low back pain, unspecified: Secondary | ICD-10-CM | POA: Diagnosis not present

## 2020-08-02 DIAGNOSIS — J301 Allergic rhinitis due to pollen: Secondary | ICD-10-CM | POA: Diagnosis not present

## 2020-08-04 DIAGNOSIS — J301 Allergic rhinitis due to pollen: Secondary | ICD-10-CM | POA: Diagnosis not present

## 2020-08-06 DIAGNOSIS — J309 Allergic rhinitis, unspecified: Secondary | ICD-10-CM | POA: Diagnosis not present

## 2020-08-09 DIAGNOSIS — J309 Allergic rhinitis, unspecified: Secondary | ICD-10-CM | POA: Diagnosis not present

## 2020-08-11 DIAGNOSIS — J309 Allergic rhinitis, unspecified: Secondary | ICD-10-CM | POA: Diagnosis not present

## 2020-08-13 DIAGNOSIS — J309 Allergic rhinitis, unspecified: Secondary | ICD-10-CM | POA: Diagnosis not present

## 2020-08-16 DIAGNOSIS — J309 Allergic rhinitis, unspecified: Secondary | ICD-10-CM | POA: Diagnosis not present

## 2020-08-18 DIAGNOSIS — J309 Allergic rhinitis, unspecified: Secondary | ICD-10-CM | POA: Diagnosis not present

## 2020-08-20 DIAGNOSIS — J309 Allergic rhinitis, unspecified: Secondary | ICD-10-CM | POA: Diagnosis not present

## 2020-08-27 DIAGNOSIS — J309 Allergic rhinitis, unspecified: Secondary | ICD-10-CM | POA: Diagnosis not present

## 2020-08-27 DIAGNOSIS — I10 Essential (primary) hypertension: Secondary | ICD-10-CM | POA: Diagnosis not present

## 2020-08-27 DIAGNOSIS — E7849 Other hyperlipidemia: Secondary | ICD-10-CM | POA: Diagnosis not present

## 2020-08-30 DIAGNOSIS — J309 Allergic rhinitis, unspecified: Secondary | ICD-10-CM | POA: Diagnosis not present

## 2020-09-01 DIAGNOSIS — J309 Allergic rhinitis, unspecified: Secondary | ICD-10-CM | POA: Diagnosis not present

## 2020-09-03 DIAGNOSIS — J309 Allergic rhinitis, unspecified: Secondary | ICD-10-CM | POA: Diagnosis not present

## 2020-09-06 DIAGNOSIS — J309 Allergic rhinitis, unspecified: Secondary | ICD-10-CM | POA: Diagnosis not present

## 2020-09-08 DIAGNOSIS — J309 Allergic rhinitis, unspecified: Secondary | ICD-10-CM | POA: Diagnosis not present

## 2020-09-10 DIAGNOSIS — J309 Allergic rhinitis, unspecified: Secondary | ICD-10-CM | POA: Diagnosis not present

## 2020-09-13 DIAGNOSIS — J309 Allergic rhinitis, unspecified: Secondary | ICD-10-CM | POA: Diagnosis not present

## 2020-09-15 DIAGNOSIS — J309 Allergic rhinitis, unspecified: Secondary | ICD-10-CM | POA: Diagnosis not present

## 2020-09-17 DIAGNOSIS — J309 Allergic rhinitis, unspecified: Secondary | ICD-10-CM | POA: Diagnosis not present

## 2020-09-20 DIAGNOSIS — J309 Allergic rhinitis, unspecified: Secondary | ICD-10-CM | POA: Diagnosis not present

## 2020-09-22 DIAGNOSIS — J309 Allergic rhinitis, unspecified: Secondary | ICD-10-CM | POA: Diagnosis not present

## 2020-09-24 DIAGNOSIS — J309 Allergic rhinitis, unspecified: Secondary | ICD-10-CM | POA: Diagnosis not present

## 2020-09-27 DIAGNOSIS — J309 Allergic rhinitis, unspecified: Secondary | ICD-10-CM | POA: Diagnosis not present

## 2020-09-28 DIAGNOSIS — M810 Age-related osteoporosis without current pathological fracture: Secondary | ICD-10-CM | POA: Diagnosis not present

## 2020-09-28 DIAGNOSIS — I351 Nonrheumatic aortic (valve) insufficiency: Secondary | ICD-10-CM | POA: Diagnosis not present

## 2020-09-29 DIAGNOSIS — J309 Allergic rhinitis, unspecified: Secondary | ICD-10-CM | POA: Diagnosis not present

## 2020-10-01 DIAGNOSIS — J309 Allergic rhinitis, unspecified: Secondary | ICD-10-CM | POA: Diagnosis not present

## 2020-10-04 DIAGNOSIS — J309 Allergic rhinitis, unspecified: Secondary | ICD-10-CM | POA: Diagnosis not present

## 2020-10-06 DIAGNOSIS — J309 Allergic rhinitis, unspecified: Secondary | ICD-10-CM | POA: Diagnosis not present

## 2020-10-13 DIAGNOSIS — J309 Allergic rhinitis, unspecified: Secondary | ICD-10-CM | POA: Diagnosis not present

## 2020-10-15 DIAGNOSIS — J309 Allergic rhinitis, unspecified: Secondary | ICD-10-CM | POA: Diagnosis not present

## 2020-10-18 DIAGNOSIS — J309 Allergic rhinitis, unspecified: Secondary | ICD-10-CM | POA: Diagnosis not present

## 2020-10-20 DIAGNOSIS — J309 Allergic rhinitis, unspecified: Secondary | ICD-10-CM | POA: Diagnosis not present

## 2020-10-22 DIAGNOSIS — J309 Allergic rhinitis, unspecified: Secondary | ICD-10-CM | POA: Diagnosis not present

## 2020-10-25 DIAGNOSIS — J309 Allergic rhinitis, unspecified: Secondary | ICD-10-CM | POA: Diagnosis not present

## 2020-10-27 DIAGNOSIS — J309 Allergic rhinitis, unspecified: Secondary | ICD-10-CM | POA: Diagnosis not present

## 2020-10-29 DIAGNOSIS — J309 Allergic rhinitis, unspecified: Secondary | ICD-10-CM | POA: Diagnosis not present

## 2020-11-01 DIAGNOSIS — J309 Allergic rhinitis, unspecified: Secondary | ICD-10-CM | POA: Diagnosis not present

## 2020-11-03 DIAGNOSIS — J309 Allergic rhinitis, unspecified: Secondary | ICD-10-CM | POA: Diagnosis not present

## 2020-11-05 DIAGNOSIS — J309 Allergic rhinitis, unspecified: Secondary | ICD-10-CM | POA: Diagnosis not present

## 2020-11-08 DIAGNOSIS — J309 Allergic rhinitis, unspecified: Secondary | ICD-10-CM | POA: Diagnosis not present

## 2020-11-10 DIAGNOSIS — J309 Allergic rhinitis, unspecified: Secondary | ICD-10-CM | POA: Diagnosis not present

## 2020-11-12 DIAGNOSIS — J309 Allergic rhinitis, unspecified: Secondary | ICD-10-CM | POA: Diagnosis not present

## 2020-11-15 DIAGNOSIS — J309 Allergic rhinitis, unspecified: Secondary | ICD-10-CM | POA: Diagnosis not present

## 2020-11-17 DIAGNOSIS — J309 Allergic rhinitis, unspecified: Secondary | ICD-10-CM | POA: Diagnosis not present

## 2020-11-19 DIAGNOSIS — J309 Allergic rhinitis, unspecified: Secondary | ICD-10-CM | POA: Diagnosis not present

## 2020-11-22 DIAGNOSIS — J309 Allergic rhinitis, unspecified: Secondary | ICD-10-CM | POA: Diagnosis not present

## 2020-11-24 DIAGNOSIS — J309 Allergic rhinitis, unspecified: Secondary | ICD-10-CM | POA: Diagnosis not present

## 2020-11-26 DIAGNOSIS — J309 Allergic rhinitis, unspecified: Secondary | ICD-10-CM | POA: Diagnosis not present

## 2020-11-29 DIAGNOSIS — I351 Nonrheumatic aortic (valve) insufficiency: Secondary | ICD-10-CM | POA: Diagnosis not present

## 2020-11-29 DIAGNOSIS — J309 Allergic rhinitis, unspecified: Secondary | ICD-10-CM | POA: Diagnosis not present

## 2020-11-29 DIAGNOSIS — M810 Age-related osteoporosis without current pathological fracture: Secondary | ICD-10-CM | POA: Diagnosis not present

## 2020-12-01 DIAGNOSIS — J309 Allergic rhinitis, unspecified: Secondary | ICD-10-CM | POA: Diagnosis not present

## 2020-12-03 DIAGNOSIS — J309 Allergic rhinitis, unspecified: Secondary | ICD-10-CM | POA: Diagnosis not present

## 2020-12-06 DIAGNOSIS — J309 Allergic rhinitis, unspecified: Secondary | ICD-10-CM | POA: Diagnosis not present

## 2020-12-08 DIAGNOSIS — J309 Allergic rhinitis, unspecified: Secondary | ICD-10-CM | POA: Diagnosis not present

## 2020-12-10 DIAGNOSIS — J309 Allergic rhinitis, unspecified: Secondary | ICD-10-CM | POA: Diagnosis not present

## 2020-12-13 DIAGNOSIS — J309 Allergic rhinitis, unspecified: Secondary | ICD-10-CM | POA: Diagnosis not present

## 2020-12-15 DIAGNOSIS — J309 Allergic rhinitis, unspecified: Secondary | ICD-10-CM | POA: Diagnosis not present

## 2020-12-17 DIAGNOSIS — J309 Allergic rhinitis, unspecified: Secondary | ICD-10-CM | POA: Diagnosis not present

## 2020-12-20 DIAGNOSIS — J309 Allergic rhinitis, unspecified: Secondary | ICD-10-CM | POA: Diagnosis not present

## 2020-12-22 DIAGNOSIS — J309 Allergic rhinitis, unspecified: Secondary | ICD-10-CM | POA: Diagnosis not present

## 2020-12-24 DIAGNOSIS — J309 Allergic rhinitis, unspecified: Secondary | ICD-10-CM | POA: Diagnosis not present

## 2020-12-29 DIAGNOSIS — J309 Allergic rhinitis, unspecified: Secondary | ICD-10-CM | POA: Diagnosis not present

## 2020-12-31 DIAGNOSIS — J309 Allergic rhinitis, unspecified: Secondary | ICD-10-CM | POA: Diagnosis not present

## 2021-01-03 DIAGNOSIS — J309 Allergic rhinitis, unspecified: Secondary | ICD-10-CM | POA: Diagnosis not present

## 2021-01-05 DIAGNOSIS — Z6841 Body Mass Index (BMI) 40.0 and over, adult: Secondary | ICD-10-CM | POA: Diagnosis not present

## 2021-01-05 DIAGNOSIS — M545 Low back pain, unspecified: Secondary | ICD-10-CM | POA: Diagnosis not present

## 2021-01-05 DIAGNOSIS — E039 Hypothyroidism, unspecified: Secondary | ICD-10-CM | POA: Diagnosis not present

## 2021-01-05 DIAGNOSIS — Z23 Encounter for immunization: Secondary | ICD-10-CM | POA: Diagnosis not present

## 2021-01-05 DIAGNOSIS — J309 Allergic rhinitis, unspecified: Secondary | ICD-10-CM | POA: Diagnosis not present

## 2021-01-05 DIAGNOSIS — I10 Essential (primary) hypertension: Secondary | ICD-10-CM | POA: Diagnosis not present

## 2021-01-05 DIAGNOSIS — Z299 Encounter for prophylactic measures, unspecified: Secondary | ICD-10-CM | POA: Diagnosis not present

## 2021-01-05 DIAGNOSIS — G4700 Insomnia, unspecified: Secondary | ICD-10-CM | POA: Diagnosis not present

## 2021-01-07 DIAGNOSIS — J309 Allergic rhinitis, unspecified: Secondary | ICD-10-CM | POA: Diagnosis not present

## 2021-01-10 DIAGNOSIS — J309 Allergic rhinitis, unspecified: Secondary | ICD-10-CM | POA: Diagnosis not present

## 2021-01-12 DIAGNOSIS — J309 Allergic rhinitis, unspecified: Secondary | ICD-10-CM | POA: Diagnosis not present

## 2021-01-14 DIAGNOSIS — J309 Allergic rhinitis, unspecified: Secondary | ICD-10-CM | POA: Diagnosis not present

## 2021-01-17 DIAGNOSIS — J309 Allergic rhinitis, unspecified: Secondary | ICD-10-CM | POA: Diagnosis not present

## 2021-01-18 DIAGNOSIS — E2839 Other primary ovarian failure: Secondary | ICD-10-CM | POA: Diagnosis not present

## 2021-01-19 DIAGNOSIS — J309 Allergic rhinitis, unspecified: Secondary | ICD-10-CM | POA: Diagnosis not present

## 2021-01-21 DIAGNOSIS — J309 Allergic rhinitis, unspecified: Secondary | ICD-10-CM | POA: Diagnosis not present

## 2021-01-24 DIAGNOSIS — J309 Allergic rhinitis, unspecified: Secondary | ICD-10-CM | POA: Diagnosis not present

## 2021-01-26 DIAGNOSIS — J309 Allergic rhinitis, unspecified: Secondary | ICD-10-CM | POA: Diagnosis not present

## 2021-01-28 DIAGNOSIS — J309 Allergic rhinitis, unspecified: Secondary | ICD-10-CM | POA: Diagnosis not present

## 2021-01-31 DIAGNOSIS — J309 Allergic rhinitis, unspecified: Secondary | ICD-10-CM | POA: Diagnosis not present

## 2021-02-02 DIAGNOSIS — J309 Allergic rhinitis, unspecified: Secondary | ICD-10-CM | POA: Diagnosis not present

## 2021-02-04 DIAGNOSIS — J309 Allergic rhinitis, unspecified: Secondary | ICD-10-CM | POA: Diagnosis not present

## 2021-02-07 DIAGNOSIS — J309 Allergic rhinitis, unspecified: Secondary | ICD-10-CM | POA: Diagnosis not present

## 2021-02-09 DIAGNOSIS — J309 Allergic rhinitis, unspecified: Secondary | ICD-10-CM | POA: Diagnosis not present

## 2021-02-11 DIAGNOSIS — J309 Allergic rhinitis, unspecified: Secondary | ICD-10-CM | POA: Diagnosis not present

## 2021-02-14 DIAGNOSIS — J309 Allergic rhinitis, unspecified: Secondary | ICD-10-CM | POA: Diagnosis not present

## 2021-02-16 DIAGNOSIS — J309 Allergic rhinitis, unspecified: Secondary | ICD-10-CM | POA: Diagnosis not present

## 2021-02-18 DIAGNOSIS — J309 Allergic rhinitis, unspecified: Secondary | ICD-10-CM | POA: Diagnosis not present

## 2021-02-21 DIAGNOSIS — J309 Allergic rhinitis, unspecified: Secondary | ICD-10-CM | POA: Diagnosis not present

## 2021-02-23 DIAGNOSIS — J309 Allergic rhinitis, unspecified: Secondary | ICD-10-CM | POA: Diagnosis not present

## 2021-02-25 DIAGNOSIS — J309 Allergic rhinitis, unspecified: Secondary | ICD-10-CM | POA: Diagnosis not present

## 2021-02-28 DIAGNOSIS — J309 Allergic rhinitis, unspecified: Secondary | ICD-10-CM | POA: Diagnosis not present

## 2021-03-02 DIAGNOSIS — J309 Allergic rhinitis, unspecified: Secondary | ICD-10-CM | POA: Diagnosis not present

## 2021-03-04 DIAGNOSIS — J309 Allergic rhinitis, unspecified: Secondary | ICD-10-CM | POA: Diagnosis not present

## 2021-03-07 DIAGNOSIS — J309 Allergic rhinitis, unspecified: Secondary | ICD-10-CM | POA: Diagnosis not present

## 2021-03-09 DIAGNOSIS — J309 Allergic rhinitis, unspecified: Secondary | ICD-10-CM | POA: Diagnosis not present

## 2021-03-11 DIAGNOSIS — J309 Allergic rhinitis, unspecified: Secondary | ICD-10-CM | POA: Diagnosis not present

## 2021-03-14 DIAGNOSIS — J309 Allergic rhinitis, unspecified: Secondary | ICD-10-CM | POA: Diagnosis not present

## 2021-03-16 DIAGNOSIS — J309 Allergic rhinitis, unspecified: Secondary | ICD-10-CM | POA: Diagnosis not present

## 2021-03-18 DIAGNOSIS — J309 Allergic rhinitis, unspecified: Secondary | ICD-10-CM | POA: Diagnosis not present

## 2021-03-21 DIAGNOSIS — J309 Allergic rhinitis, unspecified: Secondary | ICD-10-CM | POA: Diagnosis not present

## 2021-03-23 DIAGNOSIS — J309 Allergic rhinitis, unspecified: Secondary | ICD-10-CM | POA: Diagnosis not present

## 2021-03-25 DIAGNOSIS — J309 Allergic rhinitis, unspecified: Secondary | ICD-10-CM | POA: Diagnosis not present

## 2021-03-28 DIAGNOSIS — J309 Allergic rhinitis, unspecified: Secondary | ICD-10-CM | POA: Diagnosis not present

## 2021-03-30 DIAGNOSIS — D692 Other nonthrombocytopenic purpura: Secondary | ICD-10-CM | POA: Diagnosis not present

## 2021-03-30 DIAGNOSIS — I10 Essential (primary) hypertension: Secondary | ICD-10-CM | POA: Diagnosis not present

## 2021-03-30 DIAGNOSIS — J309 Allergic rhinitis, unspecified: Secondary | ICD-10-CM | POA: Diagnosis not present

## 2021-03-30 DIAGNOSIS — M810 Age-related osteoporosis without current pathological fracture: Secondary | ICD-10-CM | POA: Diagnosis not present

## 2021-03-30 DIAGNOSIS — N1831 Chronic kidney disease, stage 3a: Secondary | ICD-10-CM | POA: Diagnosis not present

## 2021-03-30 DIAGNOSIS — Z299 Encounter for prophylactic measures, unspecified: Secondary | ICD-10-CM | POA: Diagnosis not present

## 2021-04-01 DIAGNOSIS — J309 Allergic rhinitis, unspecified: Secondary | ICD-10-CM | POA: Diagnosis not present

## 2021-04-04 DIAGNOSIS — J309 Allergic rhinitis, unspecified: Secondary | ICD-10-CM | POA: Diagnosis not present

## 2021-04-06 DIAGNOSIS — J309 Allergic rhinitis, unspecified: Secondary | ICD-10-CM | POA: Diagnosis not present

## 2021-04-08 DIAGNOSIS — J309 Allergic rhinitis, unspecified: Secondary | ICD-10-CM | POA: Diagnosis not present

## 2021-04-11 DIAGNOSIS — J309 Allergic rhinitis, unspecified: Secondary | ICD-10-CM | POA: Diagnosis not present

## 2021-04-13 DIAGNOSIS — J309 Allergic rhinitis, unspecified: Secondary | ICD-10-CM | POA: Diagnosis not present

## 2021-04-15 DIAGNOSIS — J309 Allergic rhinitis, unspecified: Secondary | ICD-10-CM | POA: Diagnosis not present

## 2021-04-18 DIAGNOSIS — J309 Allergic rhinitis, unspecified: Secondary | ICD-10-CM | POA: Diagnosis not present

## 2021-04-20 DIAGNOSIS — J309 Allergic rhinitis, unspecified: Secondary | ICD-10-CM | POA: Diagnosis not present

## 2021-04-22 DIAGNOSIS — J309 Allergic rhinitis, unspecified: Secondary | ICD-10-CM | POA: Diagnosis not present

## 2021-04-25 DIAGNOSIS — J309 Allergic rhinitis, unspecified: Secondary | ICD-10-CM | POA: Diagnosis not present

## 2021-04-27 DIAGNOSIS — J309 Allergic rhinitis, unspecified: Secondary | ICD-10-CM | POA: Diagnosis not present

## 2021-04-29 DIAGNOSIS — J309 Allergic rhinitis, unspecified: Secondary | ICD-10-CM | POA: Diagnosis not present

## 2021-05-02 DIAGNOSIS — J309 Allergic rhinitis, unspecified: Secondary | ICD-10-CM | POA: Diagnosis not present

## 2021-05-04 DIAGNOSIS — J309 Allergic rhinitis, unspecified: Secondary | ICD-10-CM | POA: Diagnosis not present

## 2021-05-06 DIAGNOSIS — J309 Allergic rhinitis, unspecified: Secondary | ICD-10-CM | POA: Diagnosis not present

## 2021-05-09 DIAGNOSIS — J309 Allergic rhinitis, unspecified: Secondary | ICD-10-CM | POA: Diagnosis not present

## 2021-05-11 DIAGNOSIS — J309 Allergic rhinitis, unspecified: Secondary | ICD-10-CM | POA: Diagnosis not present

## 2021-05-13 DIAGNOSIS — J309 Allergic rhinitis, unspecified: Secondary | ICD-10-CM | POA: Diagnosis not present

## 2021-05-16 DIAGNOSIS — J309 Allergic rhinitis, unspecified: Secondary | ICD-10-CM | POA: Diagnosis not present

## 2021-05-23 DIAGNOSIS — J309 Allergic rhinitis, unspecified: Secondary | ICD-10-CM | POA: Diagnosis not present

## 2021-05-25 DIAGNOSIS — J309 Allergic rhinitis, unspecified: Secondary | ICD-10-CM | POA: Diagnosis not present

## 2021-05-26 DIAGNOSIS — M722 Plantar fascial fibromatosis: Secondary | ICD-10-CM | POA: Diagnosis not present

## 2021-05-26 DIAGNOSIS — M9903 Segmental and somatic dysfunction of lumbar region: Secondary | ICD-10-CM | POA: Diagnosis not present

## 2021-05-26 DIAGNOSIS — M47816 Spondylosis without myelopathy or radiculopathy, lumbar region: Secondary | ICD-10-CM | POA: Diagnosis not present

## 2021-05-27 DIAGNOSIS — J309 Allergic rhinitis, unspecified: Secondary | ICD-10-CM | POA: Diagnosis not present

## 2021-05-30 DIAGNOSIS — M722 Plantar fascial fibromatosis: Secondary | ICD-10-CM | POA: Diagnosis not present

## 2021-05-30 DIAGNOSIS — M9903 Segmental and somatic dysfunction of lumbar region: Secondary | ICD-10-CM | POA: Diagnosis not present

## 2021-05-30 DIAGNOSIS — J309 Allergic rhinitis, unspecified: Secondary | ICD-10-CM | POA: Diagnosis not present

## 2021-05-30 DIAGNOSIS — M47816 Spondylosis without myelopathy or radiculopathy, lumbar region: Secondary | ICD-10-CM | POA: Diagnosis not present

## 2021-06-01 DIAGNOSIS — J309 Allergic rhinitis, unspecified: Secondary | ICD-10-CM | POA: Diagnosis not present

## 2021-06-02 DIAGNOSIS — M47816 Spondylosis without myelopathy or radiculopathy, lumbar region: Secondary | ICD-10-CM | POA: Diagnosis not present

## 2021-06-02 DIAGNOSIS — M9903 Segmental and somatic dysfunction of lumbar region: Secondary | ICD-10-CM | POA: Diagnosis not present

## 2021-06-02 DIAGNOSIS — M722 Plantar fascial fibromatosis: Secondary | ICD-10-CM | POA: Diagnosis not present

## 2021-06-03 DIAGNOSIS — J309 Allergic rhinitis, unspecified: Secondary | ICD-10-CM | POA: Diagnosis not present

## 2021-06-06 DIAGNOSIS — J309 Allergic rhinitis, unspecified: Secondary | ICD-10-CM | POA: Diagnosis not present

## 2021-06-07 DIAGNOSIS — M47816 Spondylosis without myelopathy or radiculopathy, lumbar region: Secondary | ICD-10-CM | POA: Diagnosis not present

## 2021-06-07 DIAGNOSIS — M9903 Segmental and somatic dysfunction of lumbar region: Secondary | ICD-10-CM | POA: Diagnosis not present

## 2021-06-07 DIAGNOSIS — M722 Plantar fascial fibromatosis: Secondary | ICD-10-CM | POA: Diagnosis not present

## 2021-06-08 DIAGNOSIS — J309 Allergic rhinitis, unspecified: Secondary | ICD-10-CM | POA: Diagnosis not present

## 2021-06-09 DIAGNOSIS — M47816 Spondylosis without myelopathy or radiculopathy, lumbar region: Secondary | ICD-10-CM | POA: Diagnosis not present

## 2021-06-09 DIAGNOSIS — M722 Plantar fascial fibromatosis: Secondary | ICD-10-CM | POA: Diagnosis not present

## 2021-06-09 DIAGNOSIS — M9903 Segmental and somatic dysfunction of lumbar region: Secondary | ICD-10-CM | POA: Diagnosis not present

## 2021-06-10 DIAGNOSIS — J309 Allergic rhinitis, unspecified: Secondary | ICD-10-CM | POA: Diagnosis not present

## 2021-06-13 DIAGNOSIS — J309 Allergic rhinitis, unspecified: Secondary | ICD-10-CM | POA: Diagnosis not present

## 2021-06-13 DIAGNOSIS — M9903 Segmental and somatic dysfunction of lumbar region: Secondary | ICD-10-CM | POA: Diagnosis not present

## 2021-06-13 DIAGNOSIS — M47816 Spondylosis without myelopathy or radiculopathy, lumbar region: Secondary | ICD-10-CM | POA: Diagnosis not present

## 2021-06-13 DIAGNOSIS — M722 Plantar fascial fibromatosis: Secondary | ICD-10-CM | POA: Diagnosis not present

## 2021-06-16 DIAGNOSIS — M9903 Segmental and somatic dysfunction of lumbar region: Secondary | ICD-10-CM | POA: Diagnosis not present

## 2021-06-16 DIAGNOSIS — M47816 Spondylosis without myelopathy or radiculopathy, lumbar region: Secondary | ICD-10-CM | POA: Diagnosis not present

## 2021-06-16 DIAGNOSIS — M722 Plantar fascial fibromatosis: Secondary | ICD-10-CM | POA: Diagnosis not present

## 2021-06-20 DIAGNOSIS — M722 Plantar fascial fibromatosis: Secondary | ICD-10-CM | POA: Diagnosis not present

## 2021-06-20 DIAGNOSIS — M9903 Segmental and somatic dysfunction of lumbar region: Secondary | ICD-10-CM | POA: Diagnosis not present

## 2021-06-20 DIAGNOSIS — M47816 Spondylosis without myelopathy or radiculopathy, lumbar region: Secondary | ICD-10-CM | POA: Diagnosis not present

## 2021-06-23 DIAGNOSIS — M722 Plantar fascial fibromatosis: Secondary | ICD-10-CM | POA: Diagnosis not present

## 2021-06-23 DIAGNOSIS — M47816 Spondylosis without myelopathy or radiculopathy, lumbar region: Secondary | ICD-10-CM | POA: Diagnosis not present

## 2021-06-23 DIAGNOSIS — M9903 Segmental and somatic dysfunction of lumbar region: Secondary | ICD-10-CM | POA: Diagnosis not present

## 2021-06-27 DIAGNOSIS — M47816 Spondylosis without myelopathy or radiculopathy, lumbar region: Secondary | ICD-10-CM | POA: Diagnosis not present

## 2021-06-27 DIAGNOSIS — M722 Plantar fascial fibromatosis: Secondary | ICD-10-CM | POA: Diagnosis not present

## 2021-06-27 DIAGNOSIS — M9903 Segmental and somatic dysfunction of lumbar region: Secondary | ICD-10-CM | POA: Diagnosis not present

## 2021-06-30 DIAGNOSIS — M47816 Spondylosis without myelopathy or radiculopathy, lumbar region: Secondary | ICD-10-CM | POA: Diagnosis not present

## 2021-06-30 DIAGNOSIS — M722 Plantar fascial fibromatosis: Secondary | ICD-10-CM | POA: Diagnosis not present

## 2021-06-30 DIAGNOSIS — M9903 Segmental and somatic dysfunction of lumbar region: Secondary | ICD-10-CM | POA: Diagnosis not present

## 2021-07-05 DIAGNOSIS — M9903 Segmental and somatic dysfunction of lumbar region: Secondary | ICD-10-CM | POA: Diagnosis not present

## 2021-07-05 DIAGNOSIS — M47816 Spondylosis without myelopathy or radiculopathy, lumbar region: Secondary | ICD-10-CM | POA: Diagnosis not present

## 2021-07-05 DIAGNOSIS — M722 Plantar fascial fibromatosis: Secondary | ICD-10-CM | POA: Diagnosis not present

## 2021-07-08 DIAGNOSIS — M47816 Spondylosis without myelopathy or radiculopathy, lumbar region: Secondary | ICD-10-CM | POA: Diagnosis not present

## 2021-07-08 DIAGNOSIS — M9903 Segmental and somatic dysfunction of lumbar region: Secondary | ICD-10-CM | POA: Diagnosis not present

## 2021-07-08 DIAGNOSIS — M722 Plantar fascial fibromatosis: Secondary | ICD-10-CM | POA: Diagnosis not present

## 2021-07-11 DIAGNOSIS — M722 Plantar fascial fibromatosis: Secondary | ICD-10-CM | POA: Diagnosis not present

## 2021-07-11 DIAGNOSIS — M9903 Segmental and somatic dysfunction of lumbar region: Secondary | ICD-10-CM | POA: Diagnosis not present

## 2021-07-11 DIAGNOSIS — M47816 Spondylosis without myelopathy or radiculopathy, lumbar region: Secondary | ICD-10-CM | POA: Diagnosis not present

## 2021-07-14 DIAGNOSIS — M722 Plantar fascial fibromatosis: Secondary | ICD-10-CM | POA: Diagnosis not present

## 2021-07-14 DIAGNOSIS — M47816 Spondylosis without myelopathy or radiculopathy, lumbar region: Secondary | ICD-10-CM | POA: Diagnosis not present

## 2021-07-14 DIAGNOSIS — M9903 Segmental and somatic dysfunction of lumbar region: Secondary | ICD-10-CM | POA: Diagnosis not present

## 2021-07-18 DIAGNOSIS — M47816 Spondylosis without myelopathy or radiculopathy, lumbar region: Secondary | ICD-10-CM | POA: Diagnosis not present

## 2021-07-18 DIAGNOSIS — M9903 Segmental and somatic dysfunction of lumbar region: Secondary | ICD-10-CM | POA: Diagnosis not present

## 2021-07-18 DIAGNOSIS — M722 Plantar fascial fibromatosis: Secondary | ICD-10-CM | POA: Diagnosis not present

## 2021-07-21 DIAGNOSIS — M47816 Spondylosis without myelopathy or radiculopathy, lumbar region: Secondary | ICD-10-CM | POA: Diagnosis not present

## 2021-07-21 DIAGNOSIS — M722 Plantar fascial fibromatosis: Secondary | ICD-10-CM | POA: Diagnosis not present

## 2021-07-21 DIAGNOSIS — M9903 Segmental and somatic dysfunction of lumbar region: Secondary | ICD-10-CM | POA: Diagnosis not present

## 2021-07-25 DIAGNOSIS — R5383 Other fatigue: Secondary | ICD-10-CM | POA: Diagnosis not present

## 2021-07-25 DIAGNOSIS — Z789 Other specified health status: Secondary | ICD-10-CM | POA: Diagnosis not present

## 2021-07-25 DIAGNOSIS — M722 Plantar fascial fibromatosis: Secondary | ICD-10-CM | POA: Diagnosis not present

## 2021-07-25 DIAGNOSIS — E039 Hypothyroidism, unspecified: Secondary | ICD-10-CM | POA: Diagnosis not present

## 2021-07-25 DIAGNOSIS — M47816 Spondylosis without myelopathy or radiculopathy, lumbar region: Secondary | ICD-10-CM | POA: Diagnosis not present

## 2021-07-25 DIAGNOSIS — Z299 Encounter for prophylactic measures, unspecified: Secondary | ICD-10-CM | POA: Diagnosis not present

## 2021-07-25 DIAGNOSIS — Z1339 Encounter for screening examination for other mental health and behavioral disorders: Secondary | ICD-10-CM | POA: Diagnosis not present

## 2021-07-25 DIAGNOSIS — Z1331 Encounter for screening for depression: Secondary | ICD-10-CM | POA: Diagnosis not present

## 2021-07-25 DIAGNOSIS — I10 Essential (primary) hypertension: Secondary | ICD-10-CM | POA: Diagnosis not present

## 2021-07-25 DIAGNOSIS — Z6841 Body Mass Index (BMI) 40.0 and over, adult: Secondary | ICD-10-CM | POA: Diagnosis not present

## 2021-07-25 DIAGNOSIS — M9903 Segmental and somatic dysfunction of lumbar region: Secondary | ICD-10-CM | POA: Diagnosis not present

## 2021-07-25 DIAGNOSIS — Z23 Encounter for immunization: Secondary | ICD-10-CM | POA: Diagnosis not present

## 2021-07-25 DIAGNOSIS — Z79899 Other long term (current) drug therapy: Secondary | ICD-10-CM | POA: Diagnosis not present

## 2021-07-25 DIAGNOSIS — Z0000 Encounter for general adult medical examination without abnormal findings: Secondary | ICD-10-CM | POA: Diagnosis not present

## 2021-07-25 DIAGNOSIS — Z7189 Other specified counseling: Secondary | ICD-10-CM | POA: Diagnosis not present

## 2021-07-25 DIAGNOSIS — E7800 Pure hypercholesterolemia, unspecified: Secondary | ICD-10-CM | POA: Diagnosis not present

## 2021-07-28 DIAGNOSIS — M47816 Spondylosis without myelopathy or radiculopathy, lumbar region: Secondary | ICD-10-CM | POA: Diagnosis not present

## 2021-07-28 DIAGNOSIS — M9903 Segmental and somatic dysfunction of lumbar region: Secondary | ICD-10-CM | POA: Diagnosis not present

## 2021-07-28 DIAGNOSIS — M722 Plantar fascial fibromatosis: Secondary | ICD-10-CM | POA: Diagnosis not present

## 2021-08-01 DIAGNOSIS — M9903 Segmental and somatic dysfunction of lumbar region: Secondary | ICD-10-CM | POA: Diagnosis not present

## 2021-08-01 DIAGNOSIS — M47816 Spondylosis without myelopathy or radiculopathy, lumbar region: Secondary | ICD-10-CM | POA: Diagnosis not present

## 2021-08-01 DIAGNOSIS — M722 Plantar fascial fibromatosis: Secondary | ICD-10-CM | POA: Diagnosis not present

## 2021-08-04 DIAGNOSIS — M722 Plantar fascial fibromatosis: Secondary | ICD-10-CM | POA: Diagnosis not present

## 2021-08-04 DIAGNOSIS — M47816 Spondylosis without myelopathy or radiculopathy, lumbar region: Secondary | ICD-10-CM | POA: Diagnosis not present

## 2021-08-04 DIAGNOSIS — M9903 Segmental and somatic dysfunction of lumbar region: Secondary | ICD-10-CM | POA: Diagnosis not present

## 2021-08-08 DIAGNOSIS — M722 Plantar fascial fibromatosis: Secondary | ICD-10-CM | POA: Diagnosis not present

## 2021-08-08 DIAGNOSIS — M47816 Spondylosis without myelopathy or radiculopathy, lumbar region: Secondary | ICD-10-CM | POA: Diagnosis not present

## 2021-08-08 DIAGNOSIS — M9903 Segmental and somatic dysfunction of lumbar region: Secondary | ICD-10-CM | POA: Diagnosis not present

## 2021-08-11 DIAGNOSIS — M47816 Spondylosis without myelopathy or radiculopathy, lumbar region: Secondary | ICD-10-CM | POA: Diagnosis not present

## 2021-08-11 DIAGNOSIS — M722 Plantar fascial fibromatosis: Secondary | ICD-10-CM | POA: Diagnosis not present

## 2021-08-11 DIAGNOSIS — M9903 Segmental and somatic dysfunction of lumbar region: Secondary | ICD-10-CM | POA: Diagnosis not present

## 2021-08-15 DIAGNOSIS — M722 Plantar fascial fibromatosis: Secondary | ICD-10-CM | POA: Diagnosis not present

## 2021-08-15 DIAGNOSIS — M9903 Segmental and somatic dysfunction of lumbar region: Secondary | ICD-10-CM | POA: Diagnosis not present

## 2021-08-15 DIAGNOSIS — M47816 Spondylosis without myelopathy or radiculopathy, lumbar region: Secondary | ICD-10-CM | POA: Diagnosis not present

## 2021-08-22 DIAGNOSIS — M722 Plantar fascial fibromatosis: Secondary | ICD-10-CM | POA: Diagnosis not present

## 2021-08-22 DIAGNOSIS — M47816 Spondylosis without myelopathy or radiculopathy, lumbar region: Secondary | ICD-10-CM | POA: Diagnosis not present

## 2021-08-22 DIAGNOSIS — M9903 Segmental and somatic dysfunction of lumbar region: Secondary | ICD-10-CM | POA: Diagnosis not present

## 2022-01-24 DIAGNOSIS — J309 Allergic rhinitis, unspecified: Secondary | ICD-10-CM | POA: Diagnosis not present

## 2022-01-24 DIAGNOSIS — I10 Essential (primary) hypertension: Secondary | ICD-10-CM | POA: Diagnosis not present

## 2022-01-24 DIAGNOSIS — Z299 Encounter for prophylactic measures, unspecified: Secondary | ICD-10-CM | POA: Diagnosis not present

## 2022-01-24 DIAGNOSIS — Z6841 Body Mass Index (BMI) 40.0 and over, adult: Secondary | ICD-10-CM | POA: Diagnosis not present

## 2022-02-10 DIAGNOSIS — Z299 Encounter for prophylactic measures, unspecified: Secondary | ICD-10-CM | POA: Diagnosis not present

## 2022-02-10 DIAGNOSIS — L02215 Cutaneous abscess of perineum: Secondary | ICD-10-CM | POA: Diagnosis not present

## 2022-02-10 DIAGNOSIS — I10 Essential (primary) hypertension: Secondary | ICD-10-CM | POA: Diagnosis not present

## 2022-02-13 DIAGNOSIS — L02215 Cutaneous abscess of perineum: Secondary | ICD-10-CM | POA: Diagnosis not present

## 2022-02-13 DIAGNOSIS — Z299 Encounter for prophylactic measures, unspecified: Secondary | ICD-10-CM | POA: Diagnosis not present

## 2022-02-13 DIAGNOSIS — I10 Essential (primary) hypertension: Secondary | ICD-10-CM | POA: Diagnosis not present

## 2022-02-14 DIAGNOSIS — L0291 Cutaneous abscess, unspecified: Secondary | ICD-10-CM | POA: Diagnosis not present

## 2022-02-14 DIAGNOSIS — I10 Essential (primary) hypertension: Secondary | ICD-10-CM | POA: Diagnosis not present

## 2022-02-14 DIAGNOSIS — Z299 Encounter for prophylactic measures, unspecified: Secondary | ICD-10-CM | POA: Diagnosis not present

## 2022-02-16 DIAGNOSIS — Z87891 Personal history of nicotine dependence: Secondary | ICD-10-CM | POA: Diagnosis not present

## 2022-02-16 DIAGNOSIS — L02415 Cutaneous abscess of right lower limb: Secondary | ICD-10-CM | POA: Diagnosis not present

## 2022-02-16 DIAGNOSIS — L089 Local infection of the skin and subcutaneous tissue, unspecified: Secondary | ICD-10-CM | POA: Diagnosis not present

## 2022-02-16 DIAGNOSIS — I10 Essential (primary) hypertension: Secondary | ICD-10-CM | POA: Diagnosis not present

## 2022-02-17 DIAGNOSIS — Z79890 Hormone replacement therapy (postmenopausal): Secondary | ICD-10-CM | POA: Diagnosis not present

## 2022-02-17 DIAGNOSIS — Z87891 Personal history of nicotine dependence: Secondary | ICD-10-CM | POA: Diagnosis not present

## 2022-02-17 DIAGNOSIS — E11621 Type 2 diabetes mellitus with foot ulcer: Secondary | ICD-10-CM | POA: Diagnosis not present

## 2022-02-17 DIAGNOSIS — I10 Essential (primary) hypertension: Secondary | ICD-10-CM | POA: Diagnosis not present

## 2022-02-17 DIAGNOSIS — L02415 Cutaneous abscess of right lower limb: Secondary | ICD-10-CM | POA: Diagnosis not present

## 2022-02-17 DIAGNOSIS — E079 Disorder of thyroid, unspecified: Secondary | ICD-10-CM | POA: Diagnosis not present

## 2022-02-17 DIAGNOSIS — B9562 Methicillin resistant Staphylococcus aureus infection as the cause of diseases classified elsewhere: Secondary | ICD-10-CM | POA: Diagnosis not present

## 2022-02-23 DIAGNOSIS — L02415 Cutaneous abscess of right lower limb: Secondary | ICD-10-CM | POA: Diagnosis not present

## 2022-03-15 DIAGNOSIS — L02415 Cutaneous abscess of right lower limb: Secondary | ICD-10-CM | POA: Diagnosis not present

## 2022-04-05 DIAGNOSIS — L02415 Cutaneous abscess of right lower limb: Secondary | ICD-10-CM | POA: Diagnosis not present

## 2022-04-05 DIAGNOSIS — Z1339 Encounter for screening examination for other mental health and behavioral disorders: Secondary | ICD-10-CM | POA: Diagnosis not present

## 2022-04-05 DIAGNOSIS — Z7189 Other specified counseling: Secondary | ICD-10-CM | POA: Diagnosis not present

## 2022-04-05 DIAGNOSIS — Z299 Encounter for prophylactic measures, unspecified: Secondary | ICD-10-CM | POA: Diagnosis not present

## 2022-04-05 DIAGNOSIS — Z79899 Other long term (current) drug therapy: Secondary | ICD-10-CM | POA: Diagnosis not present

## 2022-04-05 DIAGNOSIS — B9562 Methicillin resistant Staphylococcus aureus infection as the cause of diseases classified elsewhere: Secondary | ICD-10-CM | POA: Diagnosis not present

## 2022-04-05 DIAGNOSIS — Z1331 Encounter for screening for depression: Secondary | ICD-10-CM | POA: Diagnosis not present

## 2022-04-05 DIAGNOSIS — Z0000 Encounter for general adult medical examination without abnormal findings: Secondary | ICD-10-CM | POA: Diagnosis not present

## 2022-04-05 DIAGNOSIS — Z87891 Personal history of nicotine dependence: Secondary | ICD-10-CM | POA: Diagnosis not present

## 2022-04-05 DIAGNOSIS — L089 Local infection of the skin and subcutaneous tissue, unspecified: Secondary | ICD-10-CM | POA: Diagnosis not present

## 2022-04-05 DIAGNOSIS — E079 Disorder of thyroid, unspecified: Secondary | ICD-10-CM | POA: Diagnosis not present

## 2022-04-05 DIAGNOSIS — E7800 Pure hypercholesterolemia, unspecified: Secondary | ICD-10-CM | POA: Diagnosis not present

## 2022-04-05 DIAGNOSIS — I10 Essential (primary) hypertension: Secondary | ICD-10-CM | POA: Diagnosis not present

## 2022-04-05 DIAGNOSIS — Z79890 Hormone replacement therapy (postmenopausal): Secondary | ICD-10-CM | POA: Diagnosis not present

## 2022-04-05 DIAGNOSIS — R5383 Other fatigue: Secondary | ICD-10-CM | POA: Diagnosis not present

## 2022-04-05 DIAGNOSIS — Z6841 Body Mass Index (BMI) 40.0 and over, adult: Secondary | ICD-10-CM | POA: Diagnosis not present

## 2022-04-05 DIAGNOSIS — E039 Hypothyroidism, unspecified: Secondary | ICD-10-CM | POA: Diagnosis not present

## 2022-04-19 DIAGNOSIS — L02415 Cutaneous abscess of right lower limb: Secondary | ICD-10-CM | POA: Diagnosis not present

## 2022-04-19 DIAGNOSIS — Z87891 Personal history of nicotine dependence: Secondary | ICD-10-CM | POA: Diagnosis not present

## 2022-04-19 DIAGNOSIS — E079 Disorder of thyroid, unspecified: Secondary | ICD-10-CM | POA: Diagnosis not present

## 2022-04-19 DIAGNOSIS — B9562 Methicillin resistant Staphylococcus aureus infection as the cause of diseases classified elsewhere: Secondary | ICD-10-CM | POA: Diagnosis not present

## 2022-04-19 DIAGNOSIS — Z79890 Hormone replacement therapy (postmenopausal): Secondary | ICD-10-CM | POA: Diagnosis not present

## 2022-04-19 DIAGNOSIS — I10 Essential (primary) hypertension: Secondary | ICD-10-CM | POA: Diagnosis not present

## 2022-04-19 DIAGNOSIS — L089 Local infection of the skin and subcutaneous tissue, unspecified: Secondary | ICD-10-CM | POA: Diagnosis not present

## 2022-04-19 DIAGNOSIS — Z79899 Other long term (current) drug therapy: Secondary | ICD-10-CM | POA: Diagnosis not present

## 2022-05-03 DIAGNOSIS — B9562 Methicillin resistant Staphylococcus aureus infection as the cause of diseases classified elsewhere: Secondary | ICD-10-CM | POA: Diagnosis not present

## 2022-05-03 DIAGNOSIS — L089 Local infection of the skin and subcutaneous tissue, unspecified: Secondary | ICD-10-CM | POA: Diagnosis not present

## 2022-05-03 DIAGNOSIS — L02415 Cutaneous abscess of right lower limb: Secondary | ICD-10-CM | POA: Diagnosis not present

## 2022-05-31 DIAGNOSIS — L02415 Cutaneous abscess of right lower limb: Secondary | ICD-10-CM | POA: Diagnosis not present

## 2022-05-31 DIAGNOSIS — Z79890 Hormone replacement therapy (postmenopausal): Secondary | ICD-10-CM | POA: Diagnosis not present

## 2022-05-31 DIAGNOSIS — Z87891 Personal history of nicotine dependence: Secondary | ICD-10-CM | POA: Diagnosis not present

## 2022-05-31 DIAGNOSIS — E079 Disorder of thyroid, unspecified: Secondary | ICD-10-CM | POA: Diagnosis not present

## 2022-05-31 DIAGNOSIS — I10 Essential (primary) hypertension: Secondary | ICD-10-CM | POA: Diagnosis not present

## 2022-05-31 DIAGNOSIS — L089 Local infection of the skin and subcutaneous tissue, unspecified: Secondary | ICD-10-CM | POA: Diagnosis not present

## 2022-05-31 DIAGNOSIS — Z79899 Other long term (current) drug therapy: Secondary | ICD-10-CM | POA: Diagnosis not present

## 2022-05-31 DIAGNOSIS — B9562 Methicillin resistant Staphylococcus aureus infection as the cause of diseases classified elsewhere: Secondary | ICD-10-CM | POA: Diagnosis not present

## 2022-07-28 DIAGNOSIS — I10 Essential (primary) hypertension: Secondary | ICD-10-CM | POA: Diagnosis not present

## 2022-07-28 DIAGNOSIS — R5383 Other fatigue: Secondary | ICD-10-CM | POA: Diagnosis not present

## 2022-07-28 DIAGNOSIS — Z6841 Body Mass Index (BMI) 40.0 and over, adult: Secondary | ICD-10-CM | POA: Diagnosis not present

## 2022-07-28 DIAGNOSIS — D692 Other nonthrombocytopenic purpura: Secondary | ICD-10-CM | POA: Diagnosis not present

## 2022-07-28 DIAGNOSIS — Z79899 Other long term (current) drug therapy: Secondary | ICD-10-CM | POA: Diagnosis not present

## 2022-07-28 DIAGNOSIS — M1990 Unspecified osteoarthritis, unspecified site: Secondary | ICD-10-CM | POA: Diagnosis not present

## 2022-07-28 DIAGNOSIS — G4700 Insomnia, unspecified: Secondary | ICD-10-CM | POA: Diagnosis not present

## 2022-07-28 DIAGNOSIS — Z0000 Encounter for general adult medical examination without abnormal findings: Secondary | ICD-10-CM | POA: Diagnosis not present

## 2022-07-28 DIAGNOSIS — Z299 Encounter for prophylactic measures, unspecified: Secondary | ICD-10-CM | POA: Diagnosis not present

## 2022-07-28 DIAGNOSIS — E7800 Pure hypercholesterolemia, unspecified: Secondary | ICD-10-CM | POA: Diagnosis not present

## 2022-10-12 DIAGNOSIS — H35033 Hypertensive retinopathy, bilateral: Secondary | ICD-10-CM | POA: Diagnosis not present

## 2022-10-12 DIAGNOSIS — H524 Presbyopia: Secondary | ICD-10-CM | POA: Diagnosis not present

## 2023-04-27 DIAGNOSIS — I10 Essential (primary) hypertension: Secondary | ICD-10-CM | POA: Diagnosis not present

## 2023-04-27 DIAGNOSIS — Z0000 Encounter for general adult medical examination without abnormal findings: Secondary | ICD-10-CM | POA: Diagnosis not present

## 2023-04-27 DIAGNOSIS — J449 Chronic obstructive pulmonary disease, unspecified: Secondary | ICD-10-CM | POA: Diagnosis not present

## 2023-04-27 DIAGNOSIS — D692 Other nonthrombocytopenic purpura: Secondary | ICD-10-CM | POA: Diagnosis not present

## 2023-04-27 DIAGNOSIS — Z7189 Other specified counseling: Secondary | ICD-10-CM | POA: Diagnosis not present

## 2023-04-27 DIAGNOSIS — Z299 Encounter for prophylactic measures, unspecified: Secondary | ICD-10-CM | POA: Diagnosis not present

## 2023-04-27 DIAGNOSIS — Z1339 Encounter for screening examination for other mental health and behavioral disorders: Secondary | ICD-10-CM | POA: Diagnosis not present

## 2023-04-27 DIAGNOSIS — Z6841 Body Mass Index (BMI) 40.0 and over, adult: Secondary | ICD-10-CM | POA: Diagnosis not present

## 2023-04-27 DIAGNOSIS — Z1331 Encounter for screening for depression: Secondary | ICD-10-CM | POA: Diagnosis not present

## 2023-07-09 DIAGNOSIS — L0231 Cutaneous abscess of buttock: Secondary | ICD-10-CM | POA: Diagnosis not present

## 2023-07-09 DIAGNOSIS — M542 Cervicalgia: Secondary | ICD-10-CM | POA: Diagnosis not present

## 2023-07-09 DIAGNOSIS — I10 Essential (primary) hypertension: Secondary | ICD-10-CM | POA: Diagnosis not present

## 2023-07-09 DIAGNOSIS — J449 Chronic obstructive pulmonary disease, unspecified: Secondary | ICD-10-CM | POA: Diagnosis not present

## 2023-07-09 DIAGNOSIS — Z299 Encounter for prophylactic measures, unspecified: Secondary | ICD-10-CM | POA: Diagnosis not present

## 2023-07-12 DIAGNOSIS — L0231 Cutaneous abscess of buttock: Secondary | ICD-10-CM | POA: Diagnosis not present

## 2023-07-12 DIAGNOSIS — Z299 Encounter for prophylactic measures, unspecified: Secondary | ICD-10-CM | POA: Diagnosis not present

## 2023-07-12 DIAGNOSIS — F1120 Opioid dependence, uncomplicated: Secondary | ICD-10-CM | POA: Diagnosis not present

## 2023-07-12 DIAGNOSIS — I10 Essential (primary) hypertension: Secondary | ICD-10-CM | POA: Diagnosis not present

## 2023-07-12 DIAGNOSIS — J449 Chronic obstructive pulmonary disease, unspecified: Secondary | ICD-10-CM | POA: Diagnosis not present

## 2023-07-31 DIAGNOSIS — R5383 Other fatigue: Secondary | ICD-10-CM | POA: Diagnosis not present

## 2023-07-31 DIAGNOSIS — Z299 Encounter for prophylactic measures, unspecified: Secondary | ICD-10-CM | POA: Diagnosis not present

## 2023-07-31 DIAGNOSIS — E039 Hypothyroidism, unspecified: Secondary | ICD-10-CM | POA: Diagnosis not present

## 2023-07-31 DIAGNOSIS — Z0000 Encounter for general adult medical examination without abnormal findings: Secondary | ICD-10-CM | POA: Diagnosis not present

## 2023-07-31 DIAGNOSIS — Z79899 Other long term (current) drug therapy: Secondary | ICD-10-CM | POA: Diagnosis not present

## 2023-07-31 DIAGNOSIS — I10 Essential (primary) hypertension: Secondary | ICD-10-CM | POA: Diagnosis not present

## 2023-07-31 DIAGNOSIS — Z23 Encounter for immunization: Secondary | ICD-10-CM | POA: Diagnosis not present

## 2023-07-31 DIAGNOSIS — E7800 Pure hypercholesterolemia, unspecified: Secondary | ICD-10-CM | POA: Diagnosis not present

## 2023-10-09 DIAGNOSIS — H524 Presbyopia: Secondary | ICD-10-CM | POA: Diagnosis not present

## 2023-10-09 DIAGNOSIS — H35033 Hypertensive retinopathy, bilateral: Secondary | ICD-10-CM | POA: Diagnosis not present

## 2024-01-15 DIAGNOSIS — M1990 Unspecified osteoarthritis, unspecified site: Secondary | ICD-10-CM | POA: Diagnosis not present

## 2024-01-15 DIAGNOSIS — Z79899 Other long term (current) drug therapy: Secondary | ICD-10-CM | POA: Diagnosis not present

## 2024-01-15 DIAGNOSIS — G4700 Insomnia, unspecified: Secondary | ICD-10-CM | POA: Diagnosis not present

## 2024-01-15 DIAGNOSIS — J449 Chronic obstructive pulmonary disease, unspecified: Secondary | ICD-10-CM | POA: Diagnosis not present

## 2024-01-15 DIAGNOSIS — I10 Essential (primary) hypertension: Secondary | ICD-10-CM | POA: Diagnosis not present

## 2024-01-15 DIAGNOSIS — Z299 Encounter for prophylactic measures, unspecified: Secondary | ICD-10-CM | POA: Diagnosis not present

## 2024-05-06 DIAGNOSIS — Z299 Encounter for prophylactic measures, unspecified: Secondary | ICD-10-CM | POA: Diagnosis not present

## 2024-05-06 DIAGNOSIS — Z7189 Other specified counseling: Secondary | ICD-10-CM | POA: Diagnosis not present

## 2024-05-06 DIAGNOSIS — Z1339 Encounter for screening examination for other mental health and behavioral disorders: Secondary | ICD-10-CM | POA: Diagnosis not present

## 2024-05-06 DIAGNOSIS — Z0000 Encounter for general adult medical examination without abnormal findings: Secondary | ICD-10-CM | POA: Diagnosis not present

## 2024-05-06 DIAGNOSIS — Z1331 Encounter for screening for depression: Secondary | ICD-10-CM | POA: Diagnosis not present

## 2024-05-06 DIAGNOSIS — Z6834 Body mass index (BMI) 34.0-34.9, adult: Secondary | ICD-10-CM | POA: Diagnosis not present

## 2024-05-06 DIAGNOSIS — I10 Essential (primary) hypertension: Secondary | ICD-10-CM | POA: Diagnosis not present

## 2024-05-06 DIAGNOSIS — F1120 Opioid dependence, uncomplicated: Secondary | ICD-10-CM | POA: Diagnosis not present

## 2024-08-12 DIAGNOSIS — Z0000 Encounter for general adult medical examination without abnormal findings: Secondary | ICD-10-CM | POA: Diagnosis not present

## 2024-08-12 DIAGNOSIS — E7800 Pure hypercholesterolemia, unspecified: Secondary | ICD-10-CM | POA: Diagnosis not present

## 2024-08-12 DIAGNOSIS — I10 Essential (primary) hypertension: Secondary | ICD-10-CM | POA: Diagnosis not present

## 2024-08-12 DIAGNOSIS — Z299 Encounter for prophylactic measures, unspecified: Secondary | ICD-10-CM | POA: Diagnosis not present

## 2024-08-12 DIAGNOSIS — R5383 Other fatigue: Secondary | ICD-10-CM | POA: Diagnosis not present

## 2024-08-12 DIAGNOSIS — Z23 Encounter for immunization: Secondary | ICD-10-CM | POA: Diagnosis not present

## 2024-08-12 DIAGNOSIS — E039 Hypothyroidism, unspecified: Secondary | ICD-10-CM | POA: Diagnosis not present

## 2024-09-19 DIAGNOSIS — Z299 Encounter for prophylactic measures, unspecified: Secondary | ICD-10-CM | POA: Diagnosis not present

## 2024-09-19 DIAGNOSIS — M1990 Unspecified osteoarthritis, unspecified site: Secondary | ICD-10-CM | POA: Diagnosis not present

## 2024-09-19 DIAGNOSIS — E7800 Pure hypercholesterolemia, unspecified: Secondary | ICD-10-CM | POA: Diagnosis not present

## 2024-09-19 DIAGNOSIS — F1120 Opioid dependence, uncomplicated: Secondary | ICD-10-CM | POA: Diagnosis not present

## 2024-09-19 DIAGNOSIS — I10 Essential (primary) hypertension: Secondary | ICD-10-CM | POA: Diagnosis not present
